# Patient Record
Sex: Female | Born: 1965 | Race: White | Hispanic: No | Marital: Married | Smoking: Never smoker
Health system: Southern US, Community
[De-identification: ages and names within clinical notes are randomized; demographics above are authoritative.]

## PROBLEM LIST (undated history)

## (undated) DIAGNOSIS — K56609 Unspecified intestinal obstruction, unspecified as to partial versus complete obstruction: Secondary | ICD-10-CM

## (undated) DIAGNOSIS — M810 Age-related osteoporosis without current pathological fracture: Secondary | ICD-10-CM

## (undated) HISTORY — DX: Age-related osteoporosis without current pathological fracture: M81.0

---

## 1999-12-04 ENCOUNTER — Other Ambulatory Visit: Admission: RE | Admit: 1999-12-04 | Discharge: 1999-12-04 | Payer: Self-pay | Admitting: Obstetrics and Gynecology

## 2001-02-16 ENCOUNTER — Other Ambulatory Visit: Admission: RE | Admit: 2001-02-16 | Discharge: 2001-02-16 | Payer: Self-pay | Admitting: Obstetrics and Gynecology

## 2002-01-18 HISTORY — PX: BREAST SURGERY: SHX581

## 2002-03-08 ENCOUNTER — Other Ambulatory Visit: Admission: RE | Admit: 2002-03-08 | Discharge: 2002-03-08 | Payer: Self-pay | Admitting: Obstetrics and Gynecology

## 2003-01-19 HISTORY — PX: ABDOMINAL SURGERY: SHX537

## 2003-06-20 ENCOUNTER — Other Ambulatory Visit: Admission: RE | Admit: 2003-06-20 | Discharge: 2003-06-20 | Payer: Self-pay | Admitting: Obstetrics and Gynecology

## 2003-10-02 ENCOUNTER — Ambulatory Visit (HOSPITAL_COMMUNITY): Admission: RE | Admit: 2003-10-02 | Discharge: 2003-10-02 | Payer: Self-pay | Admitting: Gastroenterology

## 2003-11-22 ENCOUNTER — Encounter (INDEPENDENT_AMBULATORY_CARE_PROVIDER_SITE_OTHER): Payer: Self-pay | Admitting: *Deleted

## 2003-11-22 ENCOUNTER — Inpatient Hospital Stay (HOSPITAL_COMMUNITY): Admission: EM | Admit: 2003-11-22 | Discharge: 2003-12-02 | Payer: Self-pay | Admitting: Gastroenterology

## 2004-06-08 ENCOUNTER — Ambulatory Visit (HOSPITAL_COMMUNITY): Admission: RE | Admit: 2004-06-08 | Discharge: 2004-06-08 | Payer: Self-pay | Admitting: Gastroenterology

## 2004-06-08 ENCOUNTER — Encounter (INDEPENDENT_AMBULATORY_CARE_PROVIDER_SITE_OTHER): Payer: Self-pay | Admitting: Specialist

## 2004-08-10 ENCOUNTER — Other Ambulatory Visit: Admission: RE | Admit: 2004-08-10 | Discharge: 2004-08-10 | Payer: Self-pay | Admitting: Obstetrics and Gynecology

## 2007-03-08 ENCOUNTER — Encounter: Admission: RE | Admit: 2007-03-08 | Discharge: 2007-03-08 | Payer: Self-pay | Admitting: Obstetrics and Gynecology

## 2008-03-27 ENCOUNTER — Encounter: Admission: RE | Admit: 2008-03-27 | Discharge: 2008-03-27 | Payer: Self-pay | Admitting: Obstetrics and Gynecology

## 2009-07-25 ENCOUNTER — Encounter: Admission: RE | Admit: 2009-07-25 | Discharge: 2009-07-25 | Payer: Self-pay | Admitting: Obstetrics and Gynecology

## 2009-07-31 ENCOUNTER — Encounter: Admission: RE | Admit: 2009-07-31 | Discharge: 2009-07-31 | Payer: Self-pay | Admitting: Obstetrics and Gynecology

## 2010-02-08 ENCOUNTER — Encounter: Payer: Self-pay | Admitting: Obstetrics and Gynecology

## 2010-06-05 NOTE — Op Note (Signed)
NAMESHERRAL, DIROCCO               ACCOUNT NO.:  [id number]   MEDICAL RECORD NO.:  [id number]          PATIENT TYPE:  AMB   LOCATION:  ENDO                         FACILITY:  [facility]   PHYSICIAN:  Danise Edge, M.D.   DATE OF BIRTH:  1966-01-08   DATE OF PROCEDURE:  06/08/2004  DATE OF DISCHARGE:                                 OPERATIVE REPORT   PROCEDURE INDICATIONS:  Erika Burns is a 45 year old female born  05/05/1965. 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 She underwent a laparoscopic gastrojejunostomy to  relieve mechanical gastric outlet obstruction. She has re-developed some  upper abdominal discomfort and thinks her symptoms suggest recurrent gastric  outlet obstruction.   ENDOSCOPIST:  Danise Edge, M.D.   PREMEDICATION:  Versed 5 mg, Demerol 50 mg.   PROCEDURE:  After obtaining informed consent, Erika Burns was placed in the  left lateral decubitus position. I administered intravenous Demerol and  intravenous Versed to achieve conscious sedation for the procedure. The  patient's blood pressure, oxygen saturation and cardiac rhythm were  monitored throughout the procedure and documented in the medical record.   The Olympus gastroscope was passed through the posterior hypopharynx into  the proximal esophagus without difficulty. The hypopharynx, larynx and vocal  cords appeared normal.   Esophagoscopy: The proximal, mid, and lower segments of the esophageal  mucosa appear completely normal. There are no signs of erosive esophagitis  or Barrett's esophagus.   Gastroscopy: Retroflex view of the gastric cardia and fundus was normal. The  gastric body appears normal. There is a widely patent surgical anastomosis  (gastrojejunostomy). The antrum appears normal. The pylorus is closed.   Jejunoscopy: I was able to intubate the widely patent surgical anastomosis  and examined both limbs of the gastrojejunostomy. There is a  superficial  erosion with exudative base at the surgical anastomosis on the jejunal side  which was biopsied. Otherwise both limbs of the gastrojejunostomy were  normal.   ASSESSMENT:  Normal esophagogastroduodenoscopy post laparoscopic jejunostomy  except for the presence of shallow erosions on the jejunal side at her  anastomosis, which was biopsied.      MJ/MEDQ  D:  06/08/2004  T:  06/08/2004  Job:  [id number]   cc:   Vikki Ports, MD  [address]  [place]  [place] [address]

## 2010-06-05 NOTE — H&P (Signed)
Erika Burns               ACCOUNT NO.:  [id number]   MEDICAL RECORD NO.:  [id number]          PATIENT TYPE:  AMB   LOCATION:  ENDO                         FACILITY:  [facility]   PHYSICIAN:  Danise Edge, M.D.   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   HISTORY:  [name] is a 45 year old female, born August 28, 1965.  On October 02, 2003, [name] underwent an  esophagogastroduodenoscopy performed by Dr. Tasia Catchings, which revealed a  1 cm pyloric channel ulcer with partial gastric outlet obstruction; her  CLOtest was positive for Helicobacter pylori antral gastritis.   [name] has been taking Carafate 1 g 4 times daily.  For the past 3  weeks, she has had symptoms of gastric outlet obstruction manifested by  putrid-smelling belches, vomiting, and upper abdominal bloating.  She  reports no hematemesis, melena, or hematochezia.   Diagnostic esophagogastroduodenoscopy performed this afternoon revealed a  stomach filled with solid food and a pin-hole pyloric channel; her ulcer is  approximately 90-95% healed.   [name] is being admitted to the hospital for probable surgical  management of her mechanical gastric outlet obstruction.   MEDICATION ALLERGIES:  None.   CHRONIC MEDICATIONS:  Zoloft and Carafate.   PAST MEDICAL HISTORY:  Migraine headaches.   FAMILY HISTORY:  Noncontributory.   PHYSICAL EXAMINATION:  GENERAL APPEARANCE:  [name] is alert and  ambulatory.  HEENT:  Normal oral pharynx.  Nonicteric sclerae.  LUNGS:  Clear to auscultation.  CARDIAC:  Regular rhythm without murmurs.  ABDOMEN:  Soft, flat, and nontender.  No succussion splash audible.  EXTREMITIES:  No edema.   ENDOSCOPIST:  Dr. Danise Edge   PREMEDICATION:  1.  Versed 5 mg.  2.  Demerol 70 mg.   PROCEDURE:  Diagnostic esophagogastroduodenoscopy.  After obtaining informed  consent, [name] was placed in the left lateral decubitus position.  I  administered intravenous Demerol and intravenous Versed to achieve conscious  sedation for the procedure.  The patient's blood pressure, oxygen  saturation, and cardiac rhythm were monitored throughout the procedure and  documented in the medical record.   The Olympus gastroscope was passed through the posterior hypopharynx into  the proximal esophagus without difficulty.  The hypopharynx and larynx  appeared normal.  I did not visualize the vocal cords.   ESOPHAGOSCOPY:  The proximal, mid, and lower segments of the esophageal  mucosa appear normal.   GASTROSCOPY:  Upon entering the stomach, the entire stomach is filled with  solid and liquid food matter.  Retroflexed view of the gastric cardia was  normal.  I was unable to examine the gastric fundus, gastric body, or  gastric antrum with any degree of accuracy.  I was able to identify the  pyloric channel which has a pin-hole-type opening.  The pyloric channel  ulcer is 90-95% healed.  I am unable to  pass the endoscope through this  severely narrowed pyloric channel.  Biopsies were taken from the pyloric  channel.   DUODENOSCOPY:  Not performed.   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   PLAN:  1.  IV Protonix.  2.  Surgical consultation.  3.  ? trial of nasogastric suction.      MJ/MEDQ  D:  11/22/2003  T:  11/22/2003  Job:  [id number]   cc:   Abigail Miyamoto, M.D.  [address]  [place]  [place] [address]  Fax: [phone]

## 2010-06-05 NOTE — Consult Note (Signed)
Erika Burns, Erika Burns               ACCOUNT NO.:  [id number]   MEDICAL RECORD NO.:  [id number]          PATIENT TYPE:  INP   LOCATION:  0442                         FACILITY:  [facility]   PHYSICIAN:  Abigail Miyamoto, M.D. DATE OF BIRTH:  1965/07/03   DATE OF CONSULTATION:  11/22/2003  DATE OF DISCHARGE:                                   CONSULTATION   CHIEF COMPLAINT:  Gastric outlet obstruction.   REFERRING PHYSICIAN:  Danise Edge, M.D.   HISTORY OF PRESENT ILLNESS:  This is a 45 year old female who was admitted  today from __________after she was found to have a gastric outlet  obstruction following upper endoscopy.  She had been followed by Dr. Laural Benes  earlier this year and had had a large peptic ulcer and now over the past two  weeks has developed worsening belching, vomiting, and upper abdominal  bloating, prompting the endoscopy today.  Since finding a near occlusion of  the pyloric channel, the patient has been admitted for surgical evaluation.  She currently reports she has no abdominal  pain and no nausea and would  like to try liquids.  She has been moving her bowels well and has had no  chest pain, fevers, or shortness of breath.   PAST MEDICAL HISTORY:  1.  Remarkable for the ulcer disease.  2.  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  3.  Migraine headaches.   PAST SURGICAL HISTORY:  Breast augmentation.   MEDICATIONS:  1.  Zoloft.  2.  Carafate.   ALLERGIES:  She has no known drug allergies.   FAMILY HISTORY:  Noncontributory.   SOCIAL HISTORY:  She drinks alcohol just on occasion and does not smoke.   REVIEW OF SYSTEMS:  Otherwise unremarkable.  She has been moving her bowels  well.   PHYSICAL EXAMINATION:  GENERAL:  Reveals a thin female in no acute distress.  VITAL SIGNS:  She is afebrile.  Her vital signs are stable.  HEENT:  Eyes:  She is anicteric.  Pupils are reactive bilaterally.  Ears,  nose, mouth,  throat:  __________nose normal.  Hearing is normal.  Oropharynx  is clear.  NECK:  Supple.  There is no cervical adenopathy.  There is no thyromegaly.  LUNGS:  Clear to auscultation bilaterally.  Normal respiratory effort.  CARDIOVASCULAR:  Regular rate and rhythm.  There are no murmurs.  There is  no peripheral edema.  EXTREMITIES:  Warm and well perfused.  ABDOMEN:  Soft and nontender.  It is not distended.  There are no masses.  There is no organomegaly.   LABORATORY DATA:  I have the pictures of the endoscopy from Dr. Laural Benes and  records of his care of her.   IMPRESSION/PLAN:  This is a patient with gastric outlet obstruction from  pyloric channel ulcer and scarring.  At this point I have discussed the case  with Dr. Lovie Chol.  She thinks that [name] would be an  appropriate candidate for a laparoscopic gastrojejunostomy to bypass the  area of obstruction.  I am  currently let the patient try liquids tonight and  if she does develop emesis, will place a nasogastric tube and suction out  the stomach the rest of the weekend.  I have discussed this with Dr.  Luan Pulling and she would like to try to get [name] on the operating room  schedule for Monday.  We will follow her closely this weekend.      DB/MEDQ  D:  11/22/2003  T:  11/23/2003  Job:  [id number]   cc:   Danise Edge, M.D.  [address]  [place]  [place] [address]  Fax: [phone]

## 2010-06-05 NOTE — Discharge Summary (Signed)
[name], [name]               ACCOUNT NO.:  [id number]   MEDICAL RECORD NO.:  [id number]          PATIENT TYPE:  INP   LOCATION:  0442                         FACILITY:  [facility]   PHYSICIAN:  Vikki Ports, MDDATE OF BIRTH:  1965-10-26   DATE OF ADMISSION:  11/22/2003  DATE OF DISCHARGE:  12/02/2003                                 DISCHARGE SUMMARY   ADMISSION DIAGNOSIS:  Gastric outlet obstruction.   DISCHARGE DIAGNOSIS:  Gastric outlet obstruction.   PROCEDURE:  Laparoscopic gastrojejunostomy.   CONDITION ON DISCHARGE:  Good and improved.   FOLLOW UP:  With me 1 week after discharge.   HOSPITAL COURSE:  The patient was admitted with a gastric outlet obstruction  and underwent upper endoscopy by Dr. Reece Agar.  This showed a near  complete gastric outlet obstruction secondary to ulcer disease.  The patient  tolerated some clears for the first few days in the hospital and then was  prepared for surgery which he underwent on November 25, 2003.  She underwent  laparoscopy and laparoscopic gastrojejunostomy.  Postoperatively, she did  well, had an NG tube in place.  This was removed on postoperative day #3.  She had Dulcolax suppository.  She was started on clear liquids, had one  episode of emesis, and abdominal x-rays were concerning for an ileus versus  a bowel obstruction; however, over the next day, she felt much better,  started having bowel movements, tolerated a regular diet, and was discharged  home.     Erika Burns   KRH/MEDQ  D:  12/23/2003  T:  12/24/2003  Job:  [id number]

## 2010-06-05 NOTE — Op Note (Signed)
Erika Burns, HEBEL               ACCOUNT NO.:  [id number]   MEDICAL RECORD NO.:  [id number]          PATIENT TYPE:  INP   LOCATION:  0442                         FACILITY:  [facility]   PHYSICIAN:  Vikki Ports, MDDATE OF BIRTH:  1966-01-12   DATE OF PROCEDURE:  11/25/2003  DATE OF DISCHARGE:                                 OPERATIVE REPORT   PREOPERATIVE DIAGNOSIS:  Gastric outlet obstruction.   POSTOPERATIVE DIAGNOSIS:  Gastric outlet obstruction.   PROCEDURE:  Laparoscopic gastrojejunostomy.   SURGEON:  Vikki Ports, MD   ASSISTANT:  Thornton Park. Daphine Deutscher, MD   ANESTHESIA:  General.   DESCRIPTION OF PROCEDURE:  Patient was taken to the operating room and  placed in the supine position.  After adequate anesthesia was induced using  the endotracheal tube, the abdomen was prepped and draped in the normal  sterile fashion using an 11 mm Optiview trocar in the right mid abdomen.  Peritoneal access was obtained, and pneumoperitoneum was obtained.  A 12 mm  trocar was placed in the right middle quadrant, and an additional 11 mm  trocar was placed in the left infraumbilical in the paramedian position, and  a 5 mm trocar was placed in the left anterior axillary line.  The ligament  of Treitz was identified, and counting 40-50 cm distal to this, a loop of  small bowel was brought up next to the stomach.  A posterior layer of 2-0  silk was fashioned in the antrum to the jejunum.  A side-to-side  gastrojejunostomy was performed in the standard fashion using a 6 cm auto-  suture blue-load GIA.  The defect was closed with a running 2-0 Vicryl  sutures.  The antral serosal layer was closed with a running 2-0 Vicryl  suture.  It was reinforced with Tisseel.  NG tube was placed into the  stomach.  Pneumoperitoneum was released.  Trocars were removed.  The 12 mm  trocar set was closed with a figure-of-eight fascial suture.  Skin was  closed with subcuticular 4-0 Monocryl.   Steri-Strips and sterile dressings  were applied.  The patient tolerated the procedure well and went to the PACU  in good condition.     [name]   KRH/MEDQ  D:  11/25/2003  T:  11/25/2003  Job:  [id number]   cc:   Danise Edge, M.D.  [address]  [place]  [place] [address]  Fax: [phone]

## 2011-05-06 ENCOUNTER — Other Ambulatory Visit: Payer: Self-pay | Admitting: Obstetrics and Gynecology

## 2011-05-06 DIAGNOSIS — N63 Unspecified lump in unspecified breast: Secondary | ICD-10-CM

## 2011-05-10 ENCOUNTER — Ambulatory Visit
Admission: RE | Admit: 2011-05-10 | Discharge: 2011-05-10 | Disposition: A | Discharge status: Home / Self Care | Payer: 59 | Source: Ambulatory Visit | Attending: Obstetrics and Gynecology | Admitting: Obstetrics and Gynecology

## 2011-05-10 ENCOUNTER — Other Ambulatory Visit: Payer: Self-pay | Admitting: Obstetrics and Gynecology

## 2011-05-10 DIAGNOSIS — N63 Unspecified lump in unspecified breast: Secondary | ICD-10-CM

## 2011-07-19 ENCOUNTER — Other Ambulatory Visit: Payer: Self-pay | Admitting: Obstetrics and Gynecology

## 2011-07-19 DIAGNOSIS — N6009 Solitary cyst of unspecified breast: Secondary | ICD-10-CM

## 2011-08-09 ENCOUNTER — Ambulatory Visit
Admission: RE | Admit: 2011-08-09 | Discharge: 2011-08-09 | Disposition: A | Discharge status: Home / Self Care | Payer: 59 | Source: Ambulatory Visit | Attending: Obstetrics and Gynecology | Admitting: Obstetrics and Gynecology

## 2011-08-09 DIAGNOSIS — N6009 Solitary cyst of unspecified breast: Secondary | ICD-10-CM

## 2011-12-27 ENCOUNTER — Other Ambulatory Visit: Payer: Self-pay | Admitting: Family Medicine

## 2011-12-27 DIAGNOSIS — R109 Unspecified abdominal pain: Secondary | ICD-10-CM

## 2011-12-31 ENCOUNTER — Ambulatory Visit
Admission: RE | Admit: 2011-12-31 | Discharge: 2011-12-31 | Disposition: A | Discharge status: Home / Self Care | Payer: 59 | Source: Ambulatory Visit | Attending: Family Medicine | Admitting: Family Medicine

## 2011-12-31 DIAGNOSIS — R109 Unspecified abdominal pain: Secondary | ICD-10-CM

## 2012-04-05 ENCOUNTER — Other Ambulatory Visit: Payer: Self-pay | Admitting: Gastroenterology

## 2012-04-05 DIAGNOSIS — R1032 Left lower quadrant pain: Secondary | ICD-10-CM

## 2012-04-07 ENCOUNTER — Ambulatory Visit
Admission: RE | Admit: 2012-04-07 | Discharge: 2012-04-07 | Disposition: A | Discharge status: Home / Self Care | Payer: 59 | Source: Ambulatory Visit | Attending: Gastroenterology | Admitting: Gastroenterology

## 2012-04-07 DIAGNOSIS — R1032 Left lower quadrant pain: Secondary | ICD-10-CM

## 2012-04-07 MED ORDER — IOHEXOL 300 MG/ML  SOLN
100.0000 mL | Freq: Once | INTRAMUSCULAR | Status: AC | PRN
  Administered 2012-04-07: 100 mL via INTRAVENOUS

## 2012-04-14 ENCOUNTER — Encounter (HOSPITAL_COMMUNITY): Payer: Self-pay | Admitting: *Deleted

## 2012-04-14 ENCOUNTER — Inpatient Hospital Stay (HOSPITAL_COMMUNITY)
Admission: EM | Admit: 2012-04-14 | Discharge: 2012-04-15 | DRG: 392 | Disposition: A | Discharge status: Home / Self Care | Payer: 59 | Attending: Internal Medicine | Admitting: Internal Medicine

## 2012-04-14 DIAGNOSIS — R109 Unspecified abdominal pain: Secondary | ICD-10-CM

## 2012-04-14 DIAGNOSIS — Z8711 Personal history of peptic ulcer disease: Secondary | ICD-10-CM

## 2012-04-14 DIAGNOSIS — K3184 Gastroparesis: Principal | ICD-10-CM | POA: Diagnosis present

## 2012-04-14 DIAGNOSIS — K859 Acute pancreatitis without necrosis or infection, unspecified: Secondary | ICD-10-CM

## 2012-04-14 DIAGNOSIS — R748 Abnormal levels of other serum enzymes: Secondary | ICD-10-CM | POA: Diagnosis present

## 2012-04-14 HISTORY — DX: Unspecified intestinal obstruction, unspecified as to partial versus complete obstruction: K56.609

## 2012-04-14 HISTORY — DX: Unspecified abdominal pain: R10.9

## 2012-04-14 LAB — CBC WITH DIFFERENTIAL/PLATELET
Basophils Relative: 0 % (ref 0–1)
HCT: 37.5 % (ref 36.0–46.0)
Hemoglobin: 12.5 g/dL (ref 12.0–15.0)
Lymphs Abs: 1.9 10*3/uL (ref 0.7–4.0)
MCH: 27.5 pg (ref 26.0–34.0)
MCHC: 33.3 g/dL (ref 30.0–36.0)
Monocytes Absolute: 0.4 10*3/uL (ref 0.1–1.0)
Monocytes Relative: 5 % (ref 3–12)
Neutro Abs: 6.8 10*3/uL (ref 1.7–7.7)

## 2012-04-14 LAB — COMPREHENSIVE METABOLIC PANEL
ALT: 20 U/L (ref 0–35)
AST: 20 U/L (ref 0–37)
Albumin: 3.8 g/dL (ref 3.5–5.2)
Alkaline Phosphatase: 70 U/L (ref 39–117)
BUN: 11 mg/dL (ref 6–23)
CO2: 28 mEq/L (ref 19–32)
Calcium: 9.1 mg/dL (ref 8.4–10.5)
Chloride: 101 mEq/L (ref 96–112)
Creatinine, Ser: 0.68 mg/dL (ref 0.50–1.10)
GFR calc Af Amer: >90 mL/min (ref >90)
GFR calc non Af Amer: >90 mL/min (ref >90)
Glucose, Bld: 103 mg/dL — ABNORMAL HIGH (ref 70–99)
Potassium: 3.8 mEq/L (ref 3.5–5.1)
Sodium: 138 mEq/L (ref 135–145)
Total Bilirubin: 0.1 mg/dL — ABNORMAL LOW (ref 0.3–1.2)
Total Protein: 6.9 g/dL (ref 6.0–8.3)

## 2012-04-14 LAB — URINE MICROSCOPIC-ADD ON

## 2012-04-14 LAB — URINALYSIS, ROUTINE W REFLEX MICROSCOPIC
Glucose, UA: NEGATIVE mg/dL
Hgb urine dipstick: NEGATIVE
Protein, ur: NEGATIVE mg/dL
Specific Gravity, Urine: 1.012 (ref 1.005–1.030)
pH: 7.5 (ref 5.0–8.0)

## 2012-04-14 MED ORDER — ONDANSETRON 4 MG PO TBDP
8.0000 mg | ORAL_TABLET | Freq: Once | ORAL | Status: AC
  Administered 2012-04-14: 8 mg via ORAL

## 2012-04-14 MED ORDER — HYDROMORPHONE HCL PF 1 MG/ML IJ SOLN: 1.0000 mg | INTRAMUSCULAR | Status: AC | PRN

## 2012-04-14 MED ORDER — METOCLOPRAMIDE HCL 5 MG/ML IJ SOLN
10.0000 mg | Freq: Once | INTRAMUSCULAR | Status: AC
  Administered 2012-04-14: 10 mg via INTRAVENOUS
  Filled 2012-04-14: qty 2

## 2012-04-14 MED ORDER — SODIUM CHLORIDE 0.9 % IV SOLN
INTRAVENOUS | Status: AC
  Administered 2012-04-14 – 2012-04-15 (×2): via INTRAVENOUS

## 2012-04-14 MED ORDER — ONDANSETRON HCL 4 MG/2ML IJ SOLN: 4.0000 mg | Freq: Three times a day (TID) | INTRAMUSCULAR | Status: AC | PRN

## 2012-04-14 MED ORDER — DIPHENHYDRAMINE HCL 50 MG/ML IJ SOLN
25.0000 mg | Freq: Once | INTRAMUSCULAR | Status: AC
  Administered 2012-04-14: 25 mg via INTRAVENOUS
  Filled 2012-04-14: qty 1

## 2012-04-14 MED ORDER — ONDANSETRON 4 MG PO TBDP
ORAL_TABLET | ORAL | Status: AC
  Filled 2012-04-14: qty 2

## 2012-04-14 MED ORDER — ONDANSETRON HCL 4 MG/2ML IJ SOLN
4.0000 mg | Freq: Once | INTRAMUSCULAR | Status: AC
  Administered 2012-04-14: 4 mg via INTRAVENOUS
  Filled 2012-04-14: qty 2

## 2012-04-14 MED ORDER — ENOXAPARIN SODIUM 40 MG/0.4ML ~~LOC~~ SOLN
40.0000 mg | SUBCUTANEOUS | Status: DC
  Administered 2012-04-14: 40 mg via SUBCUTANEOUS
  Filled 2012-04-14 (×2): qty 0.4

## 2012-04-14 MED ORDER — HYDROMORPHONE HCL PF 1 MG/ML IJ SOLN
1.0000 mg | Freq: Once | INTRAMUSCULAR | Status: AC
  Administered 2012-04-14: 1 mg via INTRAVENOUS
  Filled 2012-04-14: qty 1

## 2012-04-14 MED ORDER — SODIUM CHLORIDE 0.9 % IV BOLUS (SEPSIS)
1000.0000 mL | Freq: Once | INTRAVENOUS | Status: AC
  Administered 2012-04-14: 1000 mL via INTRAVENOUS

## 2012-04-14 NOTE — ED Notes (Signed)
Pt with LLQ pain and epigastric bloating x 3 months.  Hx of bowel obstruction in 2005.  Dr Doristine Locks performed vag exam and [place] with neg results.  Last week Dr Randa Evens with [facility] ordered CT which was inconclusive.  Presently pt continues to c/o epigastric bloating approx 30 min after she eats.  Today emesis x 1 with nausea all day and lower back pain.  States bm every 3 days vs every day.

## 2012-04-14 NOTE — H&P (Signed)
History and Physical  Erika Burns ZOX:[id number] DOB: [birth year] DOA: 04/14/2012  Referring physician: ER PCP: Astrid Divine, MD   Chief Complaint: Abdominal pain  HPI: Patient is a 47 year old female with past medical history most significant for abdominal pain going on for last 4 months. Patient does have a history of bowel obstruction in 2005. She has been having crampy mid and upper abdominal pain for the last 4 months. Pain is worse after eating. Pain radiates to the back. There is associated abdominal bloating. She thought might be lactose intolerance and has cut out milk products but symptoms have persisted. Pain is moderate to severe and she rates it at 5/10. There is associated nausea but no vomiting. She denies constipation or diarrhea. Denies fever chills or sweats. She thinks she may have lost 3 pounds during the course of her illness. She has been taking acid reducers with no benefit. Her PCP I gave her prescription for tramadol which did not give any relief. She has seen her physician who has ordered ultrasounds and CT scans with no answer found. She is also seeing a gastroenterologist. She came in today because the pain is worse.  Evaluation in the ER was negative except elevated lipase. Unit physician wanted me to admit the patient for presumed pancreatitis and pain control.  Review of Systems:  15 point review of systems is negative except for as noted.  Past Medical History  Diagnosis Date  . Bowel obstruction     Past Surgical History  Procedure Laterality Date  . Abdominal surgery  2005    repair of bowel obstruction  . Breast surgery  2004    breast implants    Social History:  reports that she has never smoked. She does not have any smokeless tobacco history on file. She reports that  drinks alcohol. She reports that she does not use illicit drugs.  No Known Allergies  No family history on file.   Prior to Admission medications   Not on File    Physical Exam: Filed Vitals:   04/14/12 1720 04/14/12 2037  BP: 117/79 105/65  Pulse: 70 61  Temp: 98.4 F (36.9 C) 97.7 F (36.5 C)  TempSrc: Oral   Resp: 16 16  SpO2: 98% 100%   Physical Exam: General: Vital signs reviewed and noted. Well-developed, well-nourished, in no acute distress; alert, appropriate and cooperative throughout examination.  Head: Normocephalic, atraumatic.  Eyes: PERRL, EOMI, No signs of anemia or jaundince.  Nose: Mucous membranes moist, not inflammed, nonerythematous.  Throat: Oropharynx nonerythematous, no exudate appreciated.   Neck: No deformities, masses, or tenderness noted.Supple, No carotid Bruits, no JVD.  Lungs:  Normal respiratory effort. Clear to auscultation BL without crackles or wheezes.  Heart: RRR. S1 and S2 normal without gallop, murmur, or rubs.  Abdomen:  BS normoactive. Soft, Nondistended, mild epigastric and periumbilical tenderness.  No masses or organomegaly.  Extremities: No pretibial edema.  Neurologic: A&O X3, CN II - XII are grossly intact. Motor strength is 5/5 in the all 4 extremities, Sensations intact to light touch, Cerebellar signs negative.  Skin: No visible rashes, scars.     Wt Readings from Last 3 Encounters:  No data found for Wt    Labs on Admission:  Basic Metabolic Panel:  Recent Labs Lab 04/14/12 1725  NA 138  K 3.8  CL 101  CO2 28  GLUCOSE 103*  BUN 11  CREATININE 0.68  CALCIUM 9.1    Liver Function Tests:  Recent Labs Lab  04/14/12 1725  AST 20  ALT 20  ALKPHOS 70  BILITOT 0.1*  PROT 6.9  ALBUMIN 3.8    Recent Labs Lab 04/14/12 1725  LIPASE 133*    CBC:  Recent Labs Lab 04/14/12 1725  WBC 9.2  NEUTROABS 6.8  HGB 12.5  HCT 37.5  MCV 82.4  PLT 417*    Principal Problem:   Abdominal pain in female patient   Assessment/Plan 1. Patient is a 47 year old female with past medical history most significant for previous abdominal surgery for abdominal obstruction 5 years  ago comes in with 4 months of abdominal pain which has been worked up in the outpatient with negative abdominal ultrasound and CT scan(as per patient , results to be obtained) and currently under the care of gastroenterologist Dr. Randa Evens comes in with acute worsening of her chronic abdominal pain. He showed up in the ER was negative except elevated lipase. He does not meet the criteria for pancreatitis which is 3 times the upper limit of normal. Unit physician wants to admit the patient for pain control and further workup.  -Admit to MedSurg bed -Normal diet -Repeat lipase and check amylase in the morning -Make the patient n.p.o. if lipase is still elevated and more than 3 times upper limit of normal -Supportive treatment with pain control and IV fluids -Obtain medical records from Dr. Randa Evens and primary care physician -No indication for any imaging studies at this time  Code Status: Full code Family Communication: Updated at bedside Disposition Plan/Anticipated LOS: Guarded  Time spent: 70 minutes  Lars Mage, MD  [facility] Team 5  If 7PM-7AM, please contact night-coverage at [email or web address], password [facility] 04/14/2012, 8:53 PM

## 2012-04-14 NOTE — ED Provider Notes (Addendum)
History     CSN: [id number]  Arrival date & time 04/14/12  1653   First MD Initiated Contact with Patient 04/14/12 1941     Chief complaint: Abdominal pain  (Consider location/radiation/quality/duration/timing/severity/associated sxs/prior treatment) Patient is a 47 y.o. female presenting with abdominal pain. The history is provided by the patient.  Abdominal Pain She has been having crampy mid and upper abdominal pain for the last 4 months. Pain is worse after eating. Pain radiates to the back. There is associated abdominal bloating. She thought might be lactose intolerance and has cut out milk products but symptoms have persisted. Pain is moderate to severe and she rates it at 6/10. There is associated nausea and vomiting. She denies constipation or diarrhea. Denies fever chills or sweats. She thinks she may have lost 3 pounds during the course of her illness. She has been taking acid reducers with no benefit. Her PCP I gave her prescription for tramadol which did not give any relief. She has seen her physician who has ordered ultrasounds and CT scans with no answer found. She is also seeing a gastroenterologist. She came in today because the pain is worse.  Past Medical History  Diagnosis Date  . Bowel obstruction     Past Surgical History  Procedure Laterality Date  . Abdominal surgery  2005    repair of bowel obstruction  . Breast surgery  2004    breast implants    No family history on file.  History  Substance Use Topics  . Smoking status: Never Smoker   . Smokeless tobacco: Not on file  . Alcohol Use: Yes     Comment: occasionally    OB History   Grav Para Term Preterm Abortions TAB SAB Ect Mult Living                  Review of Systems  Gastrointestinal: Positive for abdominal pain.  All other systems reviewed and are negative.    Allergies  Review of patient's allergies indicates no known allergies.  Home Medications  No current outpatient prescriptions  on file.  BP 117/79  Pulse 70  Temp(Src) 98.4 F (36.9 C) (Oral)  Resp 16  SpO2 98%  Physical Exam  Nursing note and vitals reviewed.  47 year old female, resting comfortably and in no acute distress. Vital signs are normal. Oxygen saturation is 98%, which is normal. Head is normocephalic and atraumatic. PERRLA, EOMI. Oropharynx is clear. Neck is nontender and supple without adenopathy or JVD. Back is nontender and there is no CVA tenderness. Lungs are clear without rales, wheezes, or rhonchi. Chest is nontender. Heart has regular rate and rhythm without murmur. Abdomen is soft, flat, with mild epigastric and periumbilical tenderness. There is no rebound or guarding. There are no masses or hepatosplenomegaly and peristalsis is hypoactive. Extremities have no cyanosis or edema, full range of motion is present. Skin is warm and dry without rash. Neurologic: Mental status is normal, cranial nerves are intact, there are no motor or sensory deficits.  ED Course  Procedures (including critical care time)  Labs Reviewed  COMPREHENSIVE METABOLIC PANEL - Abnormal; Notable for the following:    Glucose, Bld 103 (*)    Total Bilirubin 0.1 (*)    All other components within normal limits  CBC WITH DIFFERENTIAL - Abnormal; Notable for the following:    Platelets 417 (*)    All other components within normal limits  LIPASE, BLOOD - Abnormal; Notable for the following:  Lipase 133 (*)    All other components within normal limits  URINALYSIS, ROUTINE W REFLEX MICROSCOPIC   No results found.   No diagnosis found.    MDM  Pancreatitis of uncertain cause. Curiously, was not seen on CT scan. Given the chronicity of the symptoms additional workup may be indicated such as ERCP or MRI.  Case is discussed with Dr. Eben Burow of [facility] who agrees to admit the patient   Dione Booze, MD 04/14/12 2004  Dione Booze, MD 04/14/12 2004

## 2012-04-15 ENCOUNTER — Encounter (HOSPITAL_COMMUNITY): Payer: Self-pay | Admitting: *Deleted

## 2012-04-15 ENCOUNTER — Encounter (HOSPITAL_COMMUNITY)
Admission: EM | Disposition: A | Discharge status: Home / Self Care | Payer: Self-pay | Source: Home / Self Care | Attending: Internal Medicine

## 2012-04-15 DIAGNOSIS — R109 Unspecified abdominal pain: Secondary | ICD-10-CM

## 2012-04-15 HISTORY — PX: ESOPHAGOGASTRODUODENOSCOPY: SHX5428

## 2012-04-15 LAB — LIPASE, BLOOD: Lipase: 47 U/L (ref 11–59)

## 2012-04-15 LAB — HM COLONOSCOPY

## 2012-04-15 SURGERY — EGD (ESOPHAGOGASTRODUODENOSCOPY)
Anesthesia: Moderate Sedation

## 2012-04-15 MED ORDER — PANTOPRAZOLE SODIUM 40 MG IV SOLR
40.0000 mg | INTRAVENOUS | Status: DC
  Administered 2012-04-15: 40 mg via INTRAVENOUS
  Filled 2012-04-15: qty 40

## 2012-04-15 MED ORDER — PANTOPRAZOLE SODIUM 40 MG PO TBEC: 40.0000 mg | DELAYED_RELEASE_TABLET | Freq: Every day | ORAL | Status: DC

## 2012-04-15 MED ORDER — PANTOPRAZOLE SODIUM 40 MG PO TBEC
40.0000 mg | DELAYED_RELEASE_TABLET | Freq: Every day | ORAL | Status: DC
  Filled 2012-04-15: qty 1

## 2012-04-15 MED ORDER — METOCLOPRAMIDE HCL 5 MG PO TABS
5.0000 mg | ORAL_TABLET | Freq: Three times a day (TID) | ORAL | Status: DC
Start: 2012-04-15 — End: 2012-04-15
  Administered 2012-04-15: 5 mg via ORAL
  Filled 2012-04-15 (×2): qty 1

## 2012-04-15 MED ORDER — DIPHENHYDRAMINE HCL 50 MG/ML IJ SOLN
INTRAMUSCULAR | Status: AC
  Filled 2012-04-15: qty 1

## 2012-04-15 MED ORDER — ACETAMINOPHEN 325 MG PO TABS
650.0000 mg | ORAL_TABLET | Freq: Four times a day (QID) | ORAL | Status: DC | PRN
  Administered 2012-04-15: 650 mg via ORAL
  Filled 2012-04-15: qty 2

## 2012-04-15 MED ORDER — METOCLOPRAMIDE HCL 5 MG PO TABS: 5.0000 mg | ORAL_TABLET | Freq: Three times a day (TID) | ORAL | Status: DC

## 2012-04-15 MED ORDER — BUTAMBEN-TETRACAINE-BENZOCAINE 2-2-14 % EX AERO
INHALATION_SPRAY | CUTANEOUS | Status: DC | PRN
  Administered 2012-04-15: 2 via TOPICAL

## 2012-04-15 MED ORDER — FENTANYL CITRATE 0.05 MG/ML IJ SOLN
INTRAMUSCULAR | Status: AC
  Filled 2012-04-15: qty 4

## 2012-04-15 MED ORDER — MIDAZOLAM HCL 5 MG/ML IJ SOLN
INTRAMUSCULAR | Status: AC
  Filled 2012-04-15: qty 2

## 2012-04-15 MED ORDER — MIDAZOLAM HCL 10 MG/2ML IJ SOLN
INTRAMUSCULAR | Status: DC | PRN
  Administered 2012-04-15 (×2): 2.5 mg via INTRAVENOUS

## 2012-04-15 MED ORDER — FENTANYL CITRATE 0.05 MG/ML IJ SOLN
INTRAMUSCULAR | Status: DC | PRN
  Administered 2012-04-15 (×2): 25 ug via INTRAVENOUS

## 2012-04-15 MED ORDER — SODIUM CHLORIDE 0.9 % IV SOLN: INTRAVENOUS | Status: DC

## 2012-04-15 NOTE — Discharge Summary (Signed)
PATIENT DETAILS Name: Erika Burns Age: 47 y.o. Sex: female Date of Birth: Jun 04, 1965 MRN: [id number]. Admit Date: 04/14/2012 Admitting Physician: Lars Mage, MD WUJ:WJXBJYN,WGNFAO [name], MD  Recommendations for Outpatient Follow-up:  1. Reassess symptoms-Reglan for presumed gastroparesis has been started. 2. If still having abdominal pain and bloating-further workup to be pursued in the outpatient setting  PRIMARY DISCHARGE DIAGNOSIS:  Principal Problem:   Abdominal pain- probably secondary to gastroparesis     PAST MEDICAL HISTORY: Past Medical History  Diagnosis Date  . Bowel obstruction     DISCHARGE MEDICATIONS:   Medication List    TAKE these medications       metoCLOPramide 5 MG tablet  Commonly known as:  REGLAN  Take 1 tablet (5 mg total) by mouth 3 (three) times daily before meals.     pantoprazole 40 MG tablet  Commonly known as:  PROTONIX  Take 1 tablet (40 mg total) by mouth daily at 12 noon.         BRIEF HPI:  See H&P, Labs, Consult and Test reports for all details in brief, patient was admitted severe abdominal pain, lab abnormalities revealed mild lipase elevation.  CONSULTATIONS:   None  PERTINENT RADIOLOGIC STUDIES: Ct Abdomen Pelvis W Contrast  04/07/2012  *RADIOLOGY REPORT*  Clinical Data: Left lower quadrant abdominal pain and bloating. Some nausea and vomiting.  Previous partial gastrectomy.  CT ABDOMEN AND PELVIS WITH CONTRAST  Technique:  Multidetector CT imaging of the abdomen and pelvis was performed following the standard protocol during bolus administration of intravenous contrast.  Contrast: OMNIPAQUE IOHEXOL 300 MG/ML  SOLN  Comparison: None.  Findings: Evidence of Billroth II anastomosis.  The liver, spleen, pancreas, adrenal glands, and kidneys are normal except for a 7 mm cyst in the lower pole of the right kidney.  Biliary tree is normal.  There are no dilated loops of large or small bowel.  The cecum lies low in the right  side of the pelvis.  Normal appendix. Uterus and ovaries are normal except for a 19 mm cyst on the left ovary.  No free air or free fluid.  No osseous abnormality.  IMPRESSION: Benign-appearing abdomen and pelvis.  Small cysts in the right kidney and on the left ovary.   Original Report Authenticated By: Francene Boyers, M.D.      PERTINENT LAB RESULTS: CBC:  Recent Labs  04/14/12 1725  WBC 9.2  HGB 12.5  HCT 37.5  PLT 417*   CMET CMP     Component Value Date/Time   NA 138 04/14/2012 1725   K 3.8 04/14/2012 1725   CL 101 04/14/2012 1725   CO2 28 04/14/2012 1725   GLUCOSE 103* 04/14/2012 1725   BUN 11 04/14/2012 1725   CREATININE 0.68 04/14/2012 1725   CALCIUM 9.1 04/14/2012 1725   PROT 6.9 04/14/2012 1725   ALBUMIN 3.8 04/14/2012 1725   AST 20 04/14/2012 1725   ALT 20 04/14/2012 1725   ALKPHOS 70 04/14/2012 1725   BILITOT 0.1* 04/14/2012 1725   GFRNONAA >90 04/14/2012 1725   GFRAA >90 04/14/2012 1725    GFR Estimated Creatinine Clearance: 73 ml/min (by C-G formula based on Cr of 0.68).  Recent Labs  04/14/12 1725 04/15/12 0500  LIPASE 133* 47  AMYLASE  --  71   No results found for this basename: CKTOTAL, CKMB, CKMBINDEX, TROPONINI,  in the last 72 hours No components found with this basename: POCBNP,  No results found for this basename: DDIMER,  in  the last 72 hours No results found for this basename: HGBA1C,  in the last 72 hours No results found for this basename: CHOL, HDL, LDLCALC, TRIG, CHOLHDL, LDLDIRECT,  in the last 72 hours No results found for this basename: TSH, T4TOTAL, FREET3, T3FREE, THYROIDAB,  in the last 72 hours No results found for this basename: VITAMINB12, FOLATE, FERRITIN, TIBC, IRON, RETICCTPCT,  in the last 72 hours Coags: No results found for this basename: PT, INR,  in the last 72 hours Microbiology: No results found for this or any previous visit (from the past 240 hour(s)).   BRIEF HOSPITAL COURSE:   Principal Problem:   Abdominal pain- likely  secondary to probable gastroparesis - Patient for the past 4 months has been having intermittent but recurrent abdominal pain. Pain is mostly after eating food. A. outpatient CT scan of the abdomen and pelvis was negative, a outpatient pelvic/transvaginal ultrasound was also negative. Because of severe abdominal pain she presented to the ED, was found to have mild lipase elevation and admitted to the hospitalist service. She was given supportive care with IV fluids and as needed narcotics. Because of prior history of peptic ulcer disease requiring Billroth II surgery, GI was consulted for a EGD. EGD was done on 3/29- with no significant abnormalities like a stomal ulcer, however old undigested food was seen suggesting that the patient likely had gastroparesis. Patient will be discharged on a trial of metoclopramide. She has been extensively educated regarding the importance of eating small but frequent meals. She will be discharged home today, she has a pre-existing appointment with Dr. Randa Evens on 4/9 she has been asked to keep. Further workup in this situation can be pursued in the outpatient setting. All of this was explained to the patient and the patient's spouse at bedside, and they were very understanding.  TODAY-DAY OF DISCHARGE:  Subjective:   [name] today has no headache,no chest abdominal pain,no new weakness tingling or numbness, feels much better wants to go home today.   Objective:   Blood pressure 113/73, pulse 50, temperature 97.5 F (36.4 C), temperature source Axillary, resp. rate 16, height 5\' 4"  (1.626 m), weight 53.797 kg (118 lb 9.6 oz), SpO2 98.00%.  Intake/Output Summary (Last 24 hours) at 04/15/12 1451 Last data filed at 04/15/12 1418  Gross per 24 hour  Intake   1600 ml  Output      0 ml  Net   1600 ml    Exam Awake Alert, Oriented *3, No new F.N deficits, Normal affect [place].AT,PERRAL Supple Neck,No JVD, No cervical lymphadenopathy appriciated.  Symmetrical  Chest wall movement, Good air movement bilaterally, CTAB RRR,No Gallops,Rubs or new Murmurs, No Parasternal Heave +ve B.Sounds, Abd Soft, Non tender, No organomegaly appriciated, No rebound -guarding or rigidity. No Cyanosis, Clubbing or edema, No new Rash or bruise  DISCHARGE CONDITION: Stable  DISPOSITION: HOME  DISCHARGE INSTRUCTIONS:    Activity:  As tolerated   Diet recommendation: Gastroparesis diet      Follow-up Information   Follow up with Astrid Divine, MD. Schedule an appointment as soon as possible for a visit in 1 week.   Contact information:   [address], [address] [place] [address] [phone]       Follow up with Vertell Novak, MD On 04/26/2012.   Contact information:   [address]  [place] [address] [phone]      Total Time spent on discharge equals 45 minutes.  SignedJeoffrey Massed 04/15/2012 2:51 PM

## 2012-04-15 NOTE — Consult Note (Signed)
Reason for Consult: Abdominal pain Referring Physician: Hospital team  Erika Burns is an 47 y.o. female.  HPI: Patient known to my partner Dr. Randa Evens with about 4 month history of slightly changing abdominal pain with some nausea that comes and goes workup so far has been nondiagnostic and her history has been reviewed and her lower bowels have changed somewhat but is not related to the pain and her family history is pertinent for diverticular disease but no gallbladder problems and she is actually feeling better today and the hospital team has suggested an endoscopy and her case was discussed with her husband as well and her office computer chart was reviewed  Past Medical History  Diagnosis Date  . Bowel obstruction     Past Surgical History  Procedure Laterality Date  . Abdominal surgery  2005    repair of bowel obstruction  . Breast surgery  2004    breast implants    History reviewed. No pertinent family history.  Social History:  reports that she has never smoked. She does not have any smokeless tobacco history on file. She reports that  drinks alcohol. She reports that she does not use illicit drugs.  Allergies: No Known Allergies  Medications: I have reviewed the patient's current medications.  Results for orders placed during the hospital encounter of 04/14/12 (from the past 48 hour(s))  COMPREHENSIVE METABOLIC PANEL     Status: Abnormal   Collection Time    04/14/12  5:25 PM      Result Value Range   Sodium 138  135 - 145 mEq/L   Potassium 3.8  3.5 - 5.1 mEq/L   Chloride 101  96 - 112 mEq/L   CO2 28  19 - 32 mEq/L   Glucose, Bld 103 (*) 70 - 99 mg/dL   BUN 11  6 - 23 mg/dL   Creatinine, Ser 4.69  0.50 - 1.10 mg/dL   Calcium 9.1  8.4 - 62.9 mg/dL   Total Protein 6.9  6.0 - 8.3 g/dL   Albumin 3.8  3.5 - 5.2 g/dL   AST 20  0 - 37 U/L   ALT 20  0 - 35 U/L   Alkaline Phosphatase 70  39 - 117 U/L   Total Bilirubin 0.1 (*) 0.3 - 1.2 mg/dL   GFR calc non Af Amer  >90  >90 mL/min   GFR calc Af Amer >90  >90 mL/min   Comment:            The eGFR has been calculated     using the CKD EPI equation.     This calculation has not been     validated in all clinical     situations.     eGFR's persistently     <90 mL/min signify     possible Chronic Kidney Disease.  CBC WITH DIFFERENTIAL     Status: Abnormal   Collection Time    04/14/12  5:25 PM      Result Value Range   WBC 9.2  4.0 - 10.5 K/uL   RBC 4.55  3.87 - 5.11 MIL/uL   Hemoglobin 12.5  12.0 - 15.0 g/dL   HCT 52.8  41.3 - 24.4 %   MCV 82.4  78.0 - 100.0 fL   MCH 27.5  26.0 - 34.0 pg   MCHC 33.3  30.0 - 36.0 g/dL   RDW 01.0  27.2 - 53.6 %   Platelets 417 (*) 150 - 400 K/uL  Neutrophils Relative 74  43 - 77 %   Neutro Abs 6.8  1.7 - 7.7 K/uL   Lymphocytes Relative 20  12 - 46 %   Lymphs Abs 1.9  0.7 - 4.0 K/uL   Monocytes Relative 5  3 - 12 %   Monocytes Absolute 0.4  0.1 - 1.0 K/uL   Eosinophils Relative 1  0 - 5 %   Eosinophils Absolute 0.1  0.0 - 0.7 K/uL   Basophils Relative 0  0 - 1 %   Basophils Absolute 0.0  0.0 - 0.1 K/uL  LIPASE, BLOOD     Status: Abnormal   Collection Time    04/14/12  5:25 PM      Result Value Range   Lipase 133 (*) 11 - 59 U/L  URINALYSIS, ROUTINE W REFLEX MICROSCOPIC     Status: Abnormal   Collection Time    04/14/12  7:24 PM      Result Value Range   Color, Urine YELLOW  YELLOW   APPearance CLEAR  CLEAR   Specific Gravity, Urine 1.012  1.005 - 1.030   pH 7.5  5.0 - 8.0   Glucose, UA NEGATIVE  NEGATIVE mg/dL   Hgb urine dipstick NEGATIVE  NEGATIVE   Bilirubin Urine NEGATIVE  NEGATIVE   Ketones, ur NEGATIVE  NEGATIVE mg/dL   Protein, ur NEGATIVE  NEGATIVE mg/dL   Urobilinogen, UA 0.2  0.0 - 1.0 mg/dL   Nitrite NEGATIVE  NEGATIVE   Leukocytes, UA SMALL (*) NEGATIVE  URINE MICROSCOPIC-ADD ON     Status: Abnormal   Collection Time    04/14/12  7:24 PM      Result Value Range   Squamous Epithelial / LPF FEW (*) RARE   WBC, UA 3-6  <3 WBC/hpf    Bacteria, UA RARE  RARE   Urine-Other RARE YEAST    LIPASE, BLOOD     Status: None   Collection Time    04/15/12  5:00 AM      Result Value Range   Lipase 47  11 - 59 U/L  AMYLASE     Status: None   Collection Time    04/15/12  5:00 AM      Result Value Range   Amylase 71  0 - 105 U/L    No results found.  ROS negative except above Blood pressure 112/66, pulse 57, temperature 97.6 F (36.4 C), temperature source Oral, resp. rate 20, height 5\' 4"  (1.626 m), weight 53.797 kg (118 lb 9.6 oz), SpO2 97.00%. Physical Exam vital signs stable afebrile no acute distress abdomen is soft nontender good bowel sound lungs clear heart regular rate and rhythm recent workup reviewed labs are okay except for minimal elevated lipase Assessment/Plan: Chronic episodic abdominal pain questionable adhesional Plan: We discussed endoscopy and will proceed today and if nondiagnostic she can probably go home and followup with my partner for further outpatient workup  Gerrard Crystal E 04/15/2012, 1:16 PM

## 2012-04-15 NOTE — Op Note (Signed)
[facility] [address] [place], [address]   ENDOSCOPY PROCEDURE REPORT  PATIENT: Erika Burns, Erika Burns  MR#: [id number] BIRTHDATE: 1965-06-10 , 46  yrs. old GENDER: Female  ENDOSCOPIST: Vida Rigger, MD REFERRED BY:  PROCEDURE DATE:  04/15/2012 PROCEDURE:   EGD, diagnostic ASA CLASS:   Class II INDICATIONS:Epigastric pain and Nausea.  MEDICATIONS: Fentanyl 50 mcg IV and Versed 5 mg IV  TOPICAL ANESTHETIC:used  DESCRIPTION OF PROCEDURE:   After the risks benefits and alternatives of the procedure were thoroughly explained, informed consent was obtained.  The Pentax Gastroscope [id number]  endoscope was introduced through the mouth and advanced to the distal jejunum , limited by Without limitations.   The instrument was slowly withdrawn as the mucosa was fully examined.other than old food in the stomach which to find the anastomosis we had to roll her on her back and  the anastomosis showed 2 widely patent limbs and we advanced a moderately long way down both limbs but no abnormalities were seen except a probable pinpoint native pylorus unfortunately we could not suction some of the food out of the stomach but no other abnormalities were seen air was suctioned scope removed patient tolerated the procedure well there was no obvious immediatecomplication        FINDINGS:1. Old food in the stomach 2 widely patent gastrojejunostomy with both limbs being evaluated 3. Probable pinpoint native pylorus  COMPLICATIONS:none  ENDOSCOPIC IMPRESSION: probable gastroparesis   RECOMMENDATIONS:soft solid diet tear with meats and uncooked vegetables consider further workup as outpatient with possible gastric emptying versus upper GI small bowel followand would try Reglan in the meantime and consider erythromycin in the future and probably okay for close outpatient followup   REPEAT EXAM: as needed   _______________________________ Vida Rigger,  MD eSigned:  Vida Rigger, MD 04/15/2012 1:52 PM    CC:  PATIENT NAME:  Erika Burns, Erika Burns MR#: [id number]

## 2012-04-15 NOTE — Progress Notes (Signed)
Patient able to understand discharge instructions, husband with her to also get info.  Copy given with 2 Rx.  Pt IV left f/a d/c without difficulty.  Pt skin intact, gait steady and able to walk with husband to car for discharge home.   Bonney Leitz RN

## 2012-04-15 NOTE — Progress Notes (Addendum)
PATIENT DETAILS Name: Erika Burns Age: 47 y.o. Sex: female Date of Birth: 12/18/1965 Admit Date: 04/14/2012 Admitting Physician Lars Mage, MD WUJ:WJXBJYN,WGNFAO [name], MD  Subjective: Admitted with abdominal pain. Apparently has been having intermittent abdominal pain for the past 4 months he did  Assessment/Plan: Principal Problem:   Abdominal pain  - Is much better this morning. Lipase was only minimally elevated on admission and has now come back to normal limits, I doubt that this is pancreatitis. -Patient has a significant history of peptic ulcer disease causing gastric outlet obstruction to 3 years ago which required a Billroth II surgery-outpatient CT scan and pelvic ultrasound are essentially unremarkable and did not reveal the cause of this patient's pain. Given her prior history of peptic ulcer disease-I think she needs EGD to make sure she does not have a stomal ulcer etc. that is the culprit here. I have made her n.p.o., I have spoken with [facility], they planned to do a endoscopy later today. Further plan and disposition will depend on the endoscopy results.  Disposition: Remain inpatient  DVT Prophylaxis: Prophylactic Lovenox  Code Status: Full code   Procedures:  None  CONSULTS:  GI  PHYSICAL EXAM: Vital signs in last 24 hours: Filed Vitals:   04/14/12 1720 04/14/12 2037 04/14/12 2100 04/15/12 0500  BP: 117/79 105/65 114/74 112/66  Pulse: 70 61 63 57  Temp: 98.4 F (36.9 C) 97.7 F (36.5 C) 97.9 F (36.6 C) 97.6 F (36.4 C)  TempSrc: Oral     Resp: 16 16 16 20   Height:   5\' 4"  (1.626 m)   Weight:   53.797 kg (118 lb 9.6 oz)   SpO2: 98% 100% 99% 97%    Weight change:  Body mass index is 20.35 kg/(m^2).   Gen Exam: Awake and alert with clear speech.   Neck: Supple, No JVD.   Chest: B/L Clear.   CVS: S1 S2 Regular, no murmurs.  Abdomen: soft, BS +, non tender, non distended.  Extremities: no edema, lower extremities warm to  touch. Neurologic: Non Focal.   Skin: No Rash.  Wounds: N/A.    Intake/Output from previous day:  Intake/Output Summary (Last 24 hours) at 04/15/12 1022 Last data filed at 04/15/12 0600  Gross per 24 hour  Intake   1100 ml  Output      0 ml  Net   1100 ml     LAB RESULTS: CBC  Recent Labs Lab 04/14/12 1725  WBC 9.2  HGB 12.5  HCT 37.5  PLT 417*  MCV 82.4  MCH 27.5  MCHC 33.3  RDW 13.8  LYMPHSABS 1.9  MONOABS 0.4  EOSABS 0.1  BASOSABS 0.0    Chemistries   Recent Labs Lab 04/14/12 1725  NA 138  K 3.8  CL 101  CO2 28  GLUCOSE 103*  BUN 11  CREATININE 0.68  CALCIUM 9.1    CBG: No results found for this basename: GLUCAP,  in the last 168 hours  GFR Estimated Creatinine Clearance: 73 ml/min (by C-G formula based on Cr of 0.68).  Coagulation profile No results found for this basename: INR, PROTIME,  in the last 168 hours  Cardiac Enzymes No results found for this basename: CK, CKMB, TROPONINI, MYOGLOBIN,  in the last 168 hours  No components found with this basename: POCBNP,  No results found for this basename: DDIMER,  in the last 72 hours No results found for this basename: HGBA1C,  in the last 72 hours No results  found for this basename: CHOL, HDL, LDLCALC, TRIG, CHOLHDL, LDLDIRECT,  in the last 72 hours No results found for this basename: TSH, T4TOTAL, FREET3, T3FREE, THYROIDAB,  in the last 72 hours No results found for this basename: VITAMINB12, FOLATE, FERRITIN, TIBC, IRON, RETICCTPCT,  in the last 72 hours  Recent Labs  04/14/12 1725 04/15/12 0500  LIPASE 133* 47  AMYLASE  --  71    Urine Studies No results found for this basename: UACOL, UAPR, USPG, UPH, UTP, UGL, UKET, UBIL, UHGB, UNIT, UROB, ULEU, UEPI, UWBC, URBC, UBAC, CAST, CRYS, UCOM, BILUA,  in the last 72 hours  MICROBIOLOGY: No results found for this or any previous visit (from the past 240 hour(s)).  RADIOLOGY STUDIES/RESULTS: Ct Abdomen Pelvis W Contrast  04/07/2012   *RADIOLOGY REPORT*  Clinical Data: Left lower quadrant abdominal pain and bloating. Some nausea and vomiting.  Previous partial gastrectomy.  CT ABDOMEN AND PELVIS WITH CONTRAST  Technique:  Multidetector CT imaging of the abdomen and pelvis was performed following the standard protocol during bolus administration of intravenous contrast.  Contrast: OMNIPAQUE IOHEXOL 300 MG/ML  SOLN  Comparison: None.  Findings: Evidence of Billroth II anastomosis.  The liver, spleen, pancreas, adrenal glands, and kidneys are normal except for a 7 mm cyst in the lower pole of the right kidney.  Biliary tree is normal.  There are no dilated loops of large or small bowel.  The cecum lies low in the right side of the pelvis.  Normal appendix. Uterus and ovaries are normal except for a 19 mm cyst on the left ovary.  No free air or free fluid.  No osseous abnormality.  IMPRESSION: Benign-appearing abdomen and pelvis.  Small cysts in the right kidney and on the left ovary.   Original Report Authenticated By: Francene Boyers, M.D.     MEDICATIONS: Scheduled Meds: . [COMPLETED] sodium chloride   Intravenous STAT  . enoxaparin (LOVENOX) injection  40 mg Subcutaneous Q24H   Continuous Infusions:  PRN Meds:.  Antibiotics: Anti-infectives   None       Jeoffrey Massed, MD  [facility] Pager:[phone]  If 7PM-7AM, please contact night-coverage [email or web address] Password [email or web address] 04/15/2012, 10:22 AM   LOS: 1 day

## 2012-04-17 ENCOUNTER — Encounter (HOSPITAL_COMMUNITY): Payer: Self-pay | Admitting: Gastroenterology

## 2012-06-10 ENCOUNTER — Other Ambulatory Visit: Payer: Self-pay | Admitting: Internal Medicine

## 2012-06-13 NOTE — Telephone Encounter (Signed)
Patient was seen by dr in hospital Needs an appt

## 2012-11-16 ENCOUNTER — Other Ambulatory Visit: Payer: Self-pay | Admitting: Obstetrics and Gynecology

## 2012-11-16 DIAGNOSIS — N6002 Solitary cyst of left breast: Secondary | ICD-10-CM

## 2012-12-04 ENCOUNTER — Ambulatory Visit
Admission: RE | Admit: 2012-12-04 | Discharge: 2012-12-04 | Disposition: A | Discharge status: Home / Self Care | Payer: 59 | Source: Ambulatory Visit | Attending: Obstetrics and Gynecology | Admitting: Obstetrics and Gynecology

## 2012-12-04 ENCOUNTER — Other Ambulatory Visit: Payer: Self-pay | Admitting: Obstetrics and Gynecology

## 2012-12-04 DIAGNOSIS — N6002 Solitary cyst of left breast: Secondary | ICD-10-CM

## 2013-09-07 ENCOUNTER — Other Ambulatory Visit: Payer: Self-pay | Admitting: Obstetrics and Gynecology

## 2013-09-10 LAB — CYTOLOGY - PAP

## 2015-02-06 IMAGING — MG MM DIGITAL DIAGNOSTIC BILAT
8 series · 8 of 8 positions shown · non-contrast
Comparison: With priors

CLINICAL DATA: The patient had a left breast cyst aspiration
05/10/2011. She did not return for a short-term interval followup
study for probable benign cysts in the left breast.

EXAM:
DIGITAL DIAGNOSTIC  BILATERAL MAMMOGRAM WITH CAD
ULTRASOUND BILATERAL BREAST

[L MLO (1 of 2)]
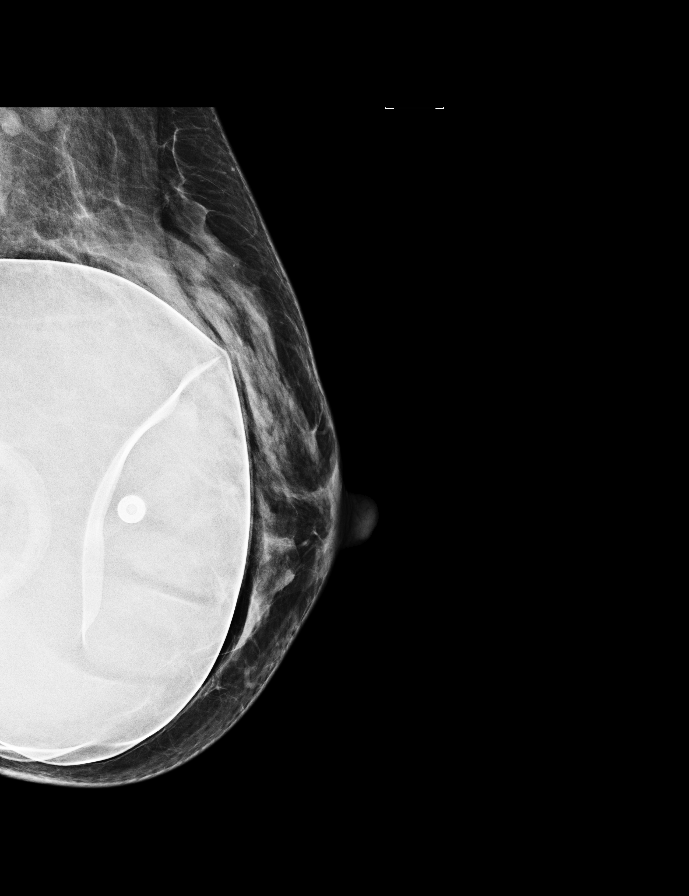

[L MLO (2 of 2)]
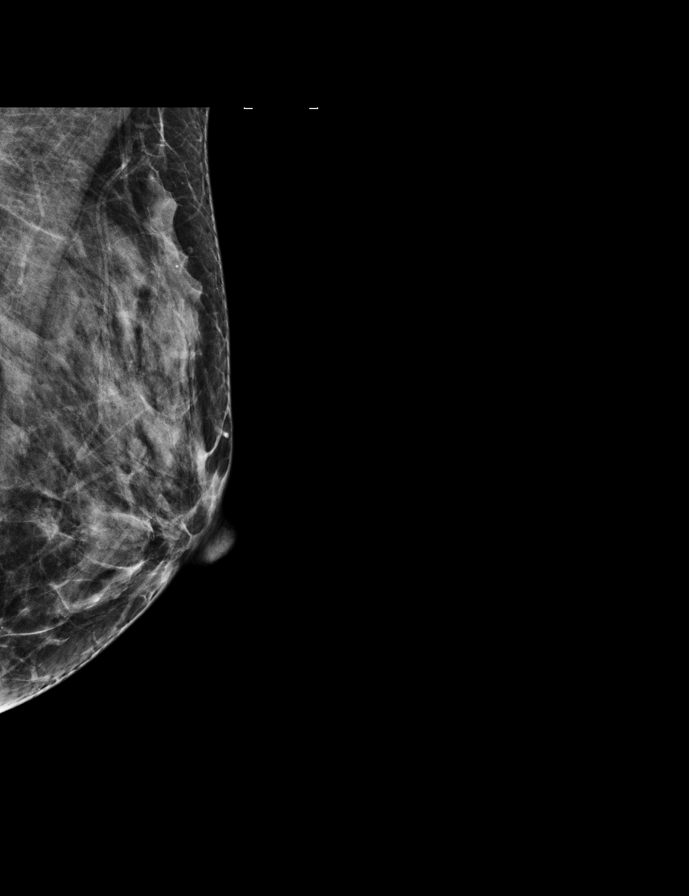

[R MLO (1 of 2)]
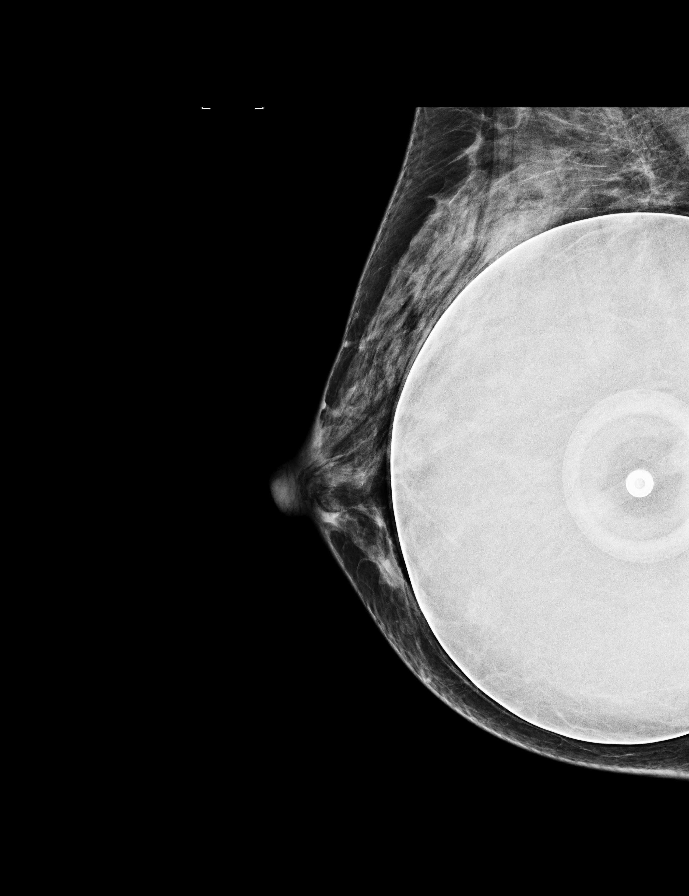

[L CC (1 of 2)]
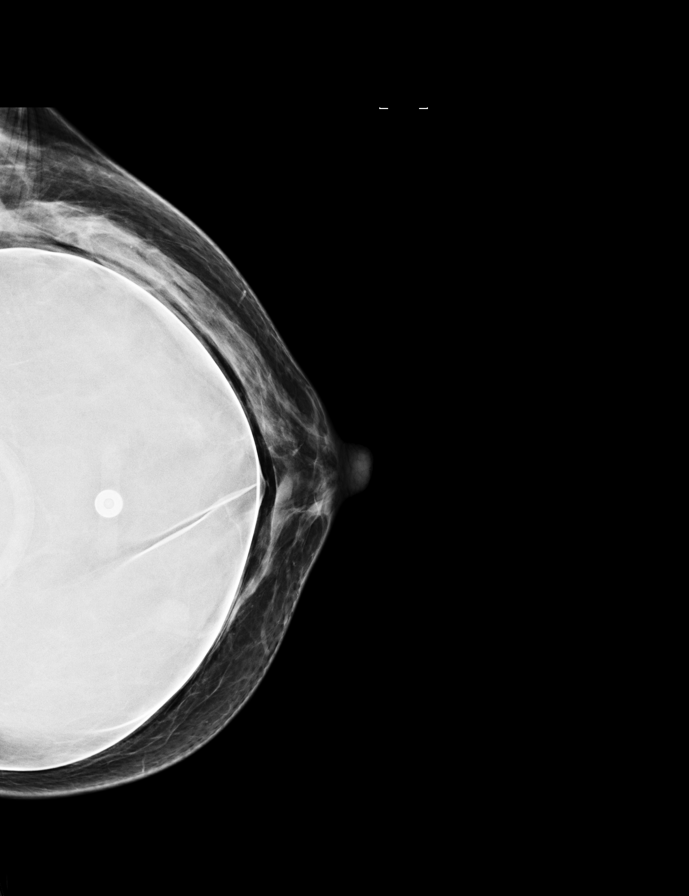

[R CC (1 of 2)]
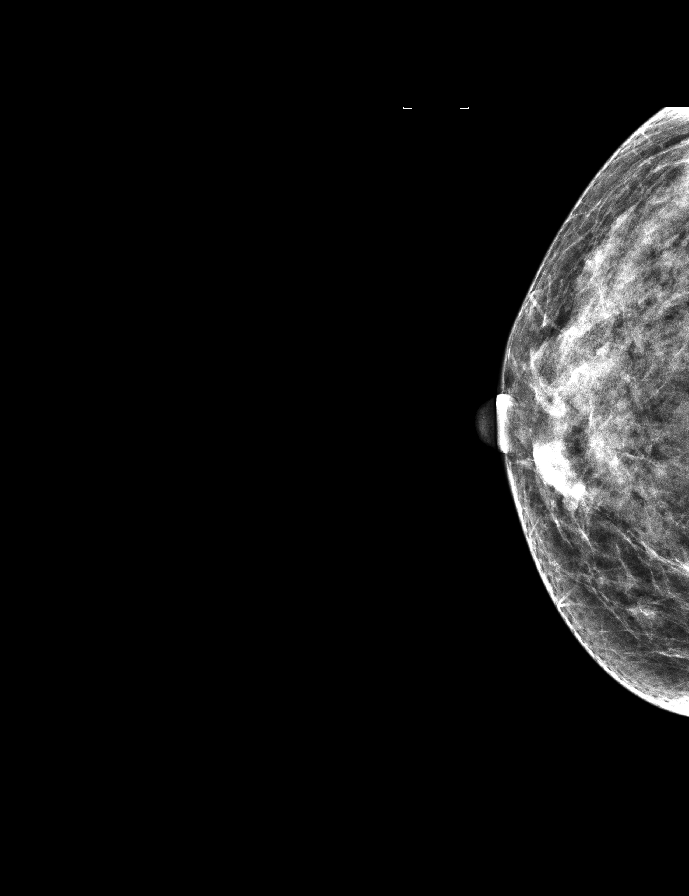

[R MLO (2 of 2)]
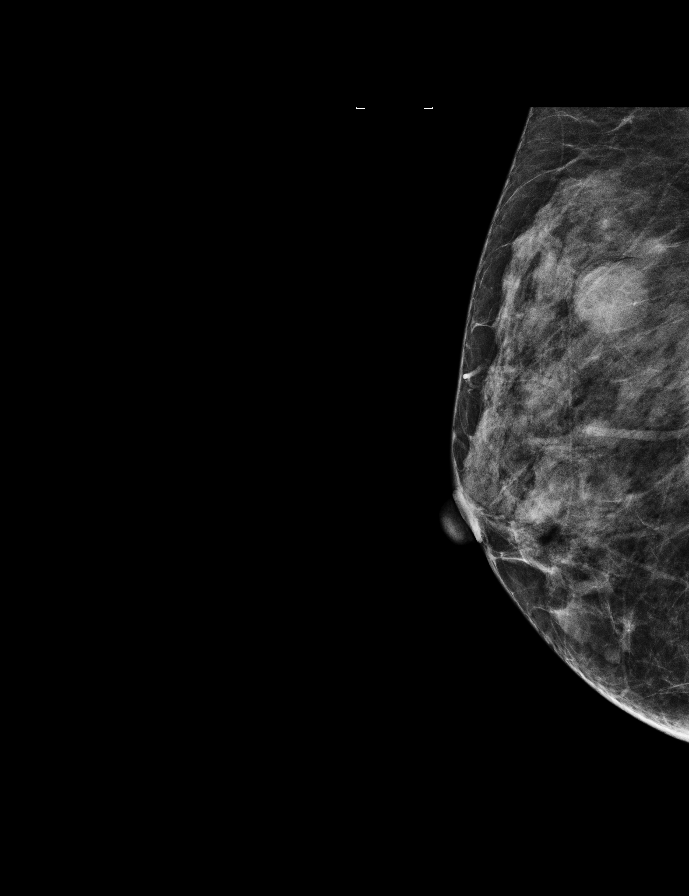

[R CC (2 of 2)]
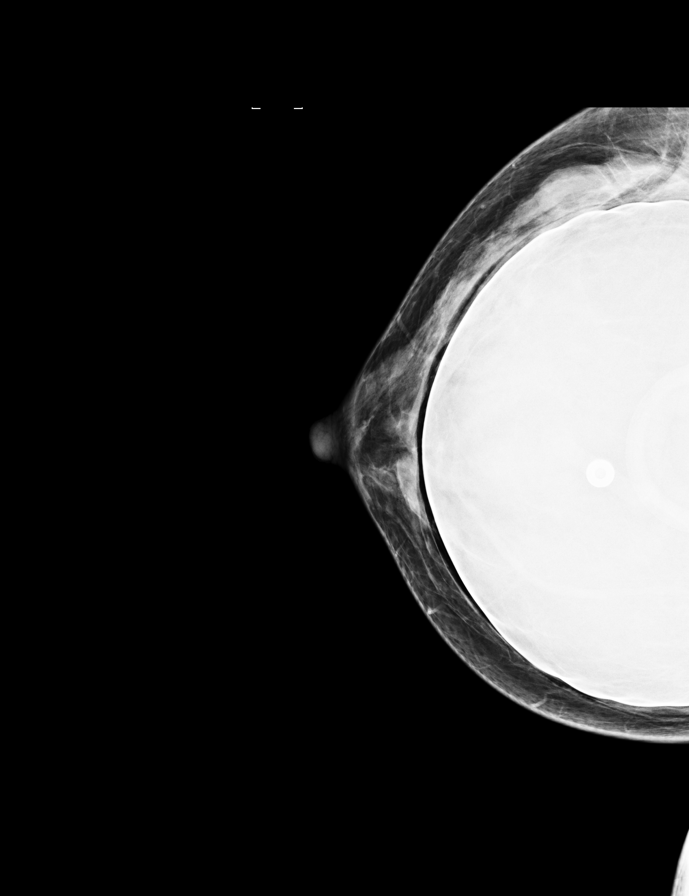

[L CC (2 of 2)]
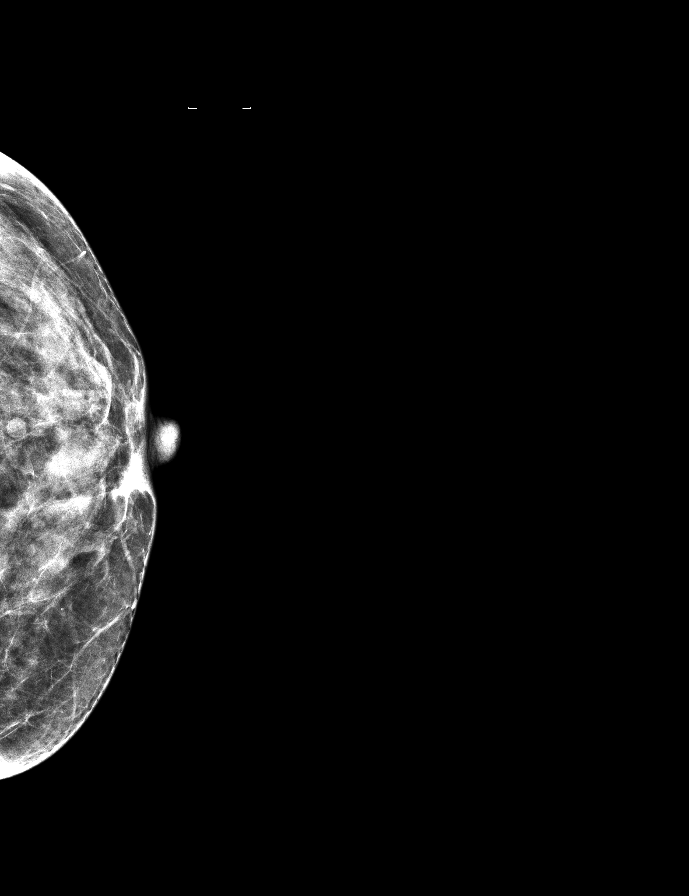

[8 of 8 positions shown; findings below may reference images not displayed]

ACR Breast Density Category c: The breasts are heterogeneously
dense, which may obscure small masses.
FINDINGS: Standard and push back views were obtained. The patient has
bilateral subpectoral implants. There is a developing
well-circumscribed 1.8 cm mass in the upper-outer quadrant of the
right breast. No suspicious mass or malignant type
microcalcifications are seen in the left breast.

Mammographic images were processed with CAD.

On physical exam,I do not palpate a mass in either breast..

Ultrasound is performed, showing a cyst in the right breast at 10
o'clock 7 cm from the nipple measuring 1.7 x 1.0 x 1.8 cm. There are
3 cysts in the left breast. There are 2 adjacent cyst in the left
breast at 12 o'clock 4 cm from the nipple measuring 5 x 5 x 5 mm and
3 x 2 x 4 mm. There is a cyst in the left breast 11 o'clock 5 cm
from the nipple measuring 8 x 5 x 8 mm. Internal debris is seen in
the cysts.
IMPRESSION: No evidence of malignancy in either breast.

RECOMMENDATION:
Bilateral screening mammogram in 1 year is recommended.

I have discussed the findings and recommendations with the patient.
Results were also provided in writing at the conclusion of the
visit.

BI-RADS CATEGORY  2: Benign Finding(s)

## 2019-03-21 ENCOUNTER — Other Ambulatory Visit: Payer: Self-pay | Admitting: Family Medicine

## 2019-03-21 DIAGNOSIS — Z1231 Encounter for screening mammogram for malignant neoplasm of breast: Secondary | ICD-10-CM

## 2021-07-29 LAB — HM MAMMOGRAPHY

## 2021-07-29 LAB — HM PAP SMEAR

## 2021-11-16 ENCOUNTER — Encounter: Payer: Self-pay | Admitting: Family

## 2021-11-16 ENCOUNTER — Ambulatory Visit (INDEPENDENT_AMBULATORY_CARE_PROVIDER_SITE_OTHER): Payer: Self-pay | Admitting: Family

## 2021-11-16 VITALS — BP 137/84 | HR 61 | Temp 97.6°F | Ht 63.5 in | Wt 126.4 lb

## 2021-11-16 DIAGNOSIS — F411 Generalized anxiety disorder: Secondary | ICD-10-CM

## 2021-11-16 DIAGNOSIS — Z23 Encounter for immunization: Secondary | ICD-10-CM

## 2021-11-16 DIAGNOSIS — M542 Cervicalgia: Secondary | ICD-10-CM

## 2021-11-16 MED ORDER — CYCLOBENZAPRINE HCL 5 MG PO TABS: 5.0000 mg | ORAL_TABLET | Freq: Three times a day (TID) | ORAL | 1 refills | Status: DC | PRN

## 2021-11-16 MED ORDER — SERTRALINE HCL 50 MG PO TABS: 150.0000 mg | ORAL_TABLET | ORAL | 1 refills | Status: DC

## 2021-11-16 NOTE — Progress Notes (Signed)
New Patient Office Visit  Subjective:  Patient ID: Erika Burns, female    DOB: [birth year]  Age: 56 y.o. MRN: [id number]  CC:  Chief Complaint  Patient presents with  . Establish Care  . Anxiety    Discuss medications    HPI Erika Burns presents for establishing care today.  Anxiety:   pt reports taking Zoloft up until last year, up to 150mg , but made her very sleepy &  switched to Celexa and it made her feel worse, more tense. 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  Neck pain:     Assessment & Plan:   Problem List Items Addressed This Visit   None Visit Diagnoses     Generalized anxiety disorder    -  Primary   Relevant Medications   sertraline (ZOLOFT) 50 MG tablet   Need for immunization against influenza       Relevant Orders   Flu Vaccine QUAD 6+ mos PF IM (Fluarix Quad PF)   Cervicalgia       Relevant Medications   cyclobenzaprine (FLEXERIL) 5 MG tablet       Subjective:    Outpatient Medications Prior to Visit  Medication Sig Dispense Refill  . Ascorbic Acid (VITAMIN C) 100 MG tablet Take 100 mg by mouth daily.    . Cholecalciferol (VITAMIN D3) 1.25 MG (50000 UT) CAPS Take by mouth.    . ibandronate (BONIVA) 150 MG tablet Take 150 mg by mouth every 30 (thirty) days.    Marland Kitchen MAGNESIUM OXIDE PO Take by mouth.    . Omeprazole Magnesium 10 MG PACK Take 10 mg by mouth daily.    . sertraline (ZOLOFT) 50 MG tablet Take 1 tablet by mouth daily.    . metoCLOPramide (REGLAN) 5 MG tablet Take 1 tablet (5 mg total) by mouth 3 (three) times daily before meals. (Patient not taking: Reported on 11/16/2021) 90 tablet 0  . pantoprazole (PROTONIX) 40 MG tablet Take 1 tablet (40 mg total) by mouth daily at 12 noon. (Patient not taking: Reported on 11/16/2021) 30 tablet 0   No facility-administered medications prior to visit.   Past Medical History:  Diagnosis Date  . Bowel obstruction [facility])    Past Surgical History:  Procedure Laterality  Date  . ABDOMINAL SURGERY  2005   repair of bowel obstruction  . BREAST SURGERY  2004   breast implants  . ESOPHAGOGASTRODUODENOSCOPY N/A 04/15/2012   Procedure: ESOPHAGOGASTRODUODENOSCOPY (EGD);  Surgeon: Jeryl Columbia, MD;  Location: [facility] ENDOSCOPY;  Service: Endoscopy;  Laterality: N/A;    Objective:   Today's Vitals: BP 137/84 (BP Location: Left Arm, Patient Position: Sitting, Cuff Size: Large)   Pulse 61   Temp 97.6 F (36.4 C) (Temporal)   Ht 5' 3.5" (1.613 m)   Wt 126 lb 6.4 oz (57.3 kg)   SpO2 99%   BMI 22.04 kg/m   Physical Exam  Meds ordered this encounter  Medications  . sertraline (ZOLOFT) 50 MG tablet    Sig: Take 3 tablets (150 mg total) by mouth as directed. Take 2 tabs qhs, 1 tab qam    Dispense:  90 tablet    Refill:  1    Order Specific Question:   Supervising Provider    Answer:   ANDY, CAMILLE L [[id number]]  . cyclobenzaprine (FLEXERIL) 5 MG tablet    Sig: Take 1-2 tablets (5-10 mg total) by mouth 3 (three) times daily as needed  for muscle spasms.    Dispense:  30 tablet    Refill:  1    Order Specific Question:   Supervising Provider    Answer:   ANDY, CAMILLE L [[id number]]    Dulce Sellar, NP

## 2021-11-16 NOTE — Patient Instructions (Addendum)
Welcome to [facility], It was a pleasure meeting you today!    As discussed, I have sent your generic Zoloft and the generic Flexeril to your pharmacy.  You can schedule a physical with fasting labs as soon as convenient for you - ok to do labs a few days prior to the visit.  Please schedule a 6 month follow up visit today for follow up for Anxiety.    PLEASE NOTE: If you had any LAB tests please let us know if you have not heard back within a few days. You may see your results on MyChart before we have a chance to review them but we will give you a call once they are reviewed by [place]. If we ordered any REFERRALS today, please let us know if you have not heard from their office within the next week.  Let us know through MyChart if you are needing REFILLS, or have your pharmacy send [place] the request. You can also use MyChart to communicate with me or any office staff.

## 2021-11-17 ENCOUNTER — Encounter: Payer: Self-pay | Admitting: Family

## 2021-11-17 DIAGNOSIS — F411 Generalized anxiety disorder: Secondary | ICD-10-CM | POA: Insufficient documentation

## 2021-11-17 NOTE — Assessment & Plan Note (Signed)
   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

## 2021-11-30 ENCOUNTER — Encounter: Payer: Self-pay | Admitting: Family

## 2021-11-30 ENCOUNTER — Ambulatory Visit (INDEPENDENT_AMBULATORY_CARE_PROVIDER_SITE_OTHER): Payer: Self-pay | Admitting: Family

## 2021-11-30 VITALS — BP 137/78 | HR 62 | Temp 97.7°F | Ht 63.5 in | Wt 127.2 lb

## 2021-11-30 DIAGNOSIS — Z0000 Encounter for general adult medical examination without abnormal findings: Secondary | ICD-10-CM

## 2021-11-30 DIAGNOSIS — E782 Mixed hyperlipidemia: Secondary | ICD-10-CM

## 2021-11-30 DIAGNOSIS — M810 Age-related osteoporosis without current pathological fracture: Secondary | ICD-10-CM | POA: Insufficient documentation

## 2021-11-30 LAB — COMPREHENSIVE METABOLIC PANEL
ALT: 26 U/L (ref 0–35)
AST: 23 U/L (ref 0–37)
Albumin: 4.5 g/dL (ref 3.5–5.2)
Alkaline Phosphatase: 61 U/L (ref 39–117)
BUN: 14 mg/dL (ref 6–23)
CO2: 31 mEq/L (ref 19–32)
Calcium: 9 mg/dL (ref 8.4–10.5)
Chloride: 100 mEq/L (ref 96–112)
Creatinine, Ser: 0.73 mg/dL (ref 0.40–1.20)
GFR: 90.75 mL/min (ref >60.00)
Glucose, Bld: 92 mg/dL (ref 70–99)
Potassium: 4.1 mEq/L (ref 3.5–5.1)
Sodium: 137 mEq/L (ref 135–145)
Total Bilirubin: 0.3 mg/dL (ref 0.2–1.2)
Total Protein: 6.9 g/dL (ref 6.0–8.3)

## 2021-11-30 LAB — LIPID PANEL
Cholesterol: 215 mg/dL — ABNORMAL HIGH (ref 0–200)
HDL: 65 mg/dL (ref >39.00)
LDL Cholesterol: 128 mg/dL — ABNORMAL HIGH (ref 0–99)
NonHDL: 149.62
Total CHOL/HDL Ratio: 3
Triglycerides: 109 mg/dL (ref 0.0–149.0)
VLDL: 21.8 mg/dL (ref 0.0–40.0)

## 2021-11-30 LAB — CBC WITH DIFFERENTIAL/PLATELET
Basophils Absolute: 0 10*3/uL (ref 0.0–0.1)
Basophils Relative: 0.4 % (ref 0.0–3.0)
Eosinophils Absolute: 0 10*3/uL (ref 0.0–0.7)
Eosinophils Relative: 0.7 % (ref 0.0–5.0)
HCT: 38.1 % (ref 36.0–46.0)
Hemoglobin: 13.1 g/dL (ref 12.0–15.0)
Lymphocytes Relative: 31.4 % (ref 12.0–46.0)
Lymphs Abs: 2 10*3/uL (ref 0.7–4.0)
MCHC: 34.4 g/dL (ref 30.0–36.0)
MCV: 84.9 fl (ref 78.0–100.0)
Monocytes Absolute: 0.4 10*3/uL (ref 0.1–1.0)
Monocytes Relative: 6.8 % (ref 3.0–12.0)
Neutro Abs: 3.9 10*3/uL (ref 1.4–7.7)
Neutrophils Relative %: 60.7 % (ref 43.0–77.0)
Platelets: 374 10*3/uL (ref 150.0–400.0)
RBC: 4.49 Mil/uL (ref 3.87–5.11)
RDW: 12.9 % (ref 11.5–15.5)
WBC: 6.4 10*3/uL (ref 4.0–10.5)

## 2021-11-30 NOTE — Patient Instructions (Signed)
It was very nice to see you today!   I will review your lab results via MyChart in a few days.   Have a great week!    PLEASE NOTE:  If you had any lab tests please let us know if you have not heard back within a few days. You may see your results on MyChart before we have a chance to review them but we will give you a call once they are reviewed by us. If we ordered any referrals today, please let us know if you have not heard from their office within the next week.    

## 2021-11-30 NOTE — Assessment & Plan Note (Signed)
per DEXA scan done w/GYN in 2023 - Osteoporosis in right hip & Osteopenia in left hip - started [facility]

## 2021-11-30 NOTE — Progress Notes (Signed)
Phone [phone]  Subjective:   Patient is a 56 y.o. female presenting for annual physical.    Chief Complaint  Patient presents with   Annual Exam    Pt has no questions or concerns     See problem oriented charting- ROS- full  review of systems was completed and negative.   The following were reviewed and entered/updated in epic: Past Medical History:  Diagnosis Date   Abdominal pain in female patient 04/14/2012   Bowel obstruction [facility])    Osteoporosis    Patient Active Problem List   Diagnosis Date Noted   Age-related osteoporosis without current pathological fracture 11/30/2021   Generalized anxiety disorder 11/17/2021   Past Surgical History:  Procedure Laterality Date   ABDOMINAL SURGERY  2005   repair of bowel obstruction   BREAST SURGERY  2004   breast implants   ESOPHAGOGASTRODUODENOSCOPY N/A 04/15/2012   Procedure: ESOPHAGOGASTRODUODENOSCOPY (EGD);  Surgeon: Jeryl Columbia, MD;  Location: [facility] ENDOSCOPY;  Service: Endoscopy;  Laterality: N/A;    Family History  Problem Relation Age of Onset   Heart disease Mother    Depression Mother    Hypertension Father    Diabetes Father    Depression Father    Heart disease Brother    Learning disabilities Son     Medications- reviewed and updated Current Outpatient Medications  Medication Sig Dispense Refill   Ascorbic Acid (VITAMIN C) 100 MG tablet Take 100 mg by mouth daily.     Cholecalciferol (VITAMIN D3) 1.25 MG (50000 UT) CAPS Take by mouth.     cyclobenzaprine (FLEXERIL) 5 MG tablet Take 1-2 tablets (5-10 mg total) by mouth 3 (three) times daily as needed for muscle spasms. 30 tablet 1   ibandronate (BONIVA) 150 MG tablet Take 150 mg by mouth every 30 (thirty) days.     MAGNESIUM OXIDE PO Take by mouth.     Omeprazole Magnesium 10 MG PACK Take 10 mg by mouth daily.     sertraline (ZOLOFT) 50 MG tablet Take 3 tablets (150 mg total) by mouth as directed. Take 2 tabs qhs, 1 tab qam 90 tablet 1   No  current facility-administered medications for this visit.    Allergies-reviewed and updated No Known Allergies  Social History   Social History Narrative   Not on file    Objective:  BP 137/78 (BP Location: Left Arm, Patient Position: Sitting)   Pulse 62   Temp 97.7 F (36.5 C) (Temporal)   Ht 5' 3.5" (1.613 m)   Wt 127 lb 3.2 oz (57.7 kg)   SpO2 98%   BMI 22.18 kg/m  Physical Exam Vitals and nursing note reviewed.  Constitutional:      Appearance: Normal appearance.  HENT:     Head: Normocephalic.     Right Ear: Tympanic membrane normal.     Left Ear: Tympanic membrane normal.     Nose: Nose normal.     Mouth/Throat:     Mouth: Mucous membranes are moist.  Eyes:     Pupils: Pupils are equal, round, and reactive to light.  Cardiovascular:     Rate and Rhythm: Normal rate and regular rhythm.  Pulmonary:     Effort: Pulmonary effort is normal.     Breath sounds: Normal breath sounds.  Musculoskeletal:        General: Normal range of motion.     Cervical back: Normal range of motion.  Lymphadenopathy:     Cervical: No cervical adenopathy.  Skin:  General: Skin is warm and dry.  Neurological:     Mental Status: She is alert.  Psychiatric:        Mood and Affect: Mood normal.        Behavior: Behavior normal.     Assessment and Plan   Health Maintenance counseling: 1. Anticipatory guidance: Patient counseled regarding regular dental exams q6 months, eye exams,  avoiding smoking and second hand smoke, limiting alcohol to 1 beverage per day, no illicit drugs.   2. Risk factor reduction:  Advised patient of need for regular exercise and diet rich with fruits and vegetables to reduce risk of heart attack and stroke. Exercise- none currently.  Wt Readings from Last 3 Encounters:  11/30/21 127 lb 3.2 oz (57.7 kg)  11/16/21 126 lb 6.4 oz (57.3 kg)  04/14/12 118 lb 9.6 oz (53.8 kg)   3. Immunizations/screenings/ancillary studies Immunization History   Administered Date(s) Administered   Influenza-Unspecified 11/19/2019   PFIZER(Purple Top)SARS-COV-2 Vaccination 04/27/2019   Pfizer Covid-19 Vaccine Bivalent Booster 6yr & up 02/11/2020   Unspecified SARS-COV-2 Vaccination 06/07/2019   Zoster, Live 12/19/2019   Health Maintenance Due  Topic Date Due   Zoster Vaccines- Shingrix (1 of 2) Never done    4. Cervical cancer screening- due 03/2024 5. Breast cancer screening-  mammogram: normal, due 07/2023 6. Colon cancer screening - due 03/2022 7. Skin cancer screening- advised regular sunscreen use. Denies worrisome, changing, or new skin lesions.  8. Birth control/STD check- N/A 9. Osteoporosis screening-  done this year, Osteoporosis right hip, Osteopenia in left hip, taking Boniva qmonthly 10. Alcohol screening: 2 drinks/week 11. Smoking associated screening (lung cancer screening, AAA screen 65-75, UA)- non- smoker  Problem List Items Addressed This Visit       Musculoskeletal and Integument   Age-related osteoporosis without current pathological fracture    per DEXA scan done w/GYN in 2023 - Osteoporosis in right hip & Osteopenia in left hip - started [facility]     Other Visit Diagnoses     Annual physical exam    -  Primary   Relevant Orders   Comp Met (CMET)   CBC with Differential/Platelet   Mixed hyperlipidemia       Relevant Orders   Lipid panel      Recommended follow up: Return for any future concerns. Future Appointments  Date Time [place] 05/17/2022 10:00 AM HJeanie Sewer NP [facility]    Lab/Order associations: fasting   HJeanie Sewer NP

## 2021-12-07 NOTE — Progress Notes (Signed)
Your glucose, electrolytes, blood count, thyroid, liver & kidney function are all normal.  Your total cholesterol number & LDL (bad #) are high. Need to reduce any fried foods, alcohol, nonnutritional snacks e.g. chips/cookies,pies, cakes and candies, fatty meat (red meat), high fat dairy foods:  including cheese, milk, ice cream.  Increase fruits/vegetables/fiber.   Continue or restart an exercise routine, shooting for 30min 5-7days per week.     

## 2022-01-06 NOTE — Progress Notes (Signed)
Erika Burns D.[facility] [address] [place] [address] Phone: [phone]   Assessment and Plan:     1. Right hip pain 2. Age-related osteoporosis without current pathological fracture 3. Primary osteoarthritis of right hip -Chronic with exacerbation, initial sports medicine visit - Most consistent with osteoarthritis of right hip with intermittent flares - Of note, patient started Boniva 4 months ago.  No clear intertrochanteric or femoral fracture on x-rays today.  Will await final radiology read.  Hip pain has been present since before starting Boniva. - Start meloxicam 15 mg daily x2 weeks.  If still having pain after 2 weeks, complete 3rd-week of meloxicam. May use remaining meloxicam as needed once daily for pain control.  Do not to use additional NSAIDs while taking meloxicam.  May use Tylenol [phone] mg 2 to 3 times a day for breakthrough pain. - Start HEP and physical therapy for hip - X-ray obtained in clinic.  My interpretation: No acute fracture or dislocation.  Moderate degenerative changes in right femoral acetabular joint with decreased space, femoral head spurring, acetabular spurring, femoral head cortical changes and acetabular sclerosis  Other orders - meloxicam (MOBIC) 15 MG tablet; Take 1 tablet (15 mg total) by mouth daily.    Pertinent previous records reviewed include failure medicine note 11/30/2021   Follow Up: 4 weeks for reevaluation.  If no improvement or worsening of symptoms, could consider advanced imaging versus intra-articular CSI   Subjective:   I, Erika Burns, am serving as a Neurosurgeon for Doctor Richardean Sale  Chief Complaint: hip pain   HPI:   01/07/2022 Patient is a 56 year old female complaining of hip pain. Patient states intermittent pain for a year , right hip pain , when she abducts she has sharp pain, states she has osteoporosis of the right hip takes boniva 1 month has been on 4 months  now , at night she gets radiating pain down her leg ,no pain in flexion , ib for the pain and that seems to helps a little , no numbness or tingling, has flares that will last for a little bit of time    Relevant Historical Information: Osteoporosis on Boniva  Additional pertinent review of systems negative.   Current Outpatient Medications:    meloxicam (MOBIC) 15 MG tablet, Take 1 tablet (15 mg total) by mouth daily., Disp: 30 tablet, Rfl: 0   Ascorbic Acid (VITAMIN C) 100 MG tablet, Take 100 mg by mouth daily., Disp: , Rfl:    Cholecalciferol (VITAMIN D3) 1.25 MG (50000 UT) CAPS, Take by mouth., Disp: , Rfl:    cyclobenzaprine (FLEXERIL) 5 MG tablet, Take 1-2 tablets (5-10 mg total) by mouth 3 (three) times daily as needed for muscle spasms., Disp: 30 tablet, Rfl: 1   ibandronate (BONIVA) 150 MG tablet, Take 150 mg by mouth every 30 (thirty) days., Disp: , Rfl:    MAGNESIUM OXIDE PO, Take by mouth., Disp: , Rfl:    Omeprazole Magnesium 10 MG PACK, Take 10 mg by mouth daily., Disp: , Rfl:    sertraline (ZOLOFT) 50 MG tablet, Take 3 tablets (150 mg total) by mouth as directed. Take 2 tabs qhs, 1 tab qam, Disp: 90 tablet, Rfl: 1   Objective:     Vitals:   01/07/22 0925  BP: 110/80  Weight: 128 lb (58.1 kg)  Height: 5\' 3"  (1.6 m)      Body mass index is 22.67 kg/m.    Physical Exam:  General: awake, alert, and oriented no acute distress, nontoxic Skin: no suspicious lesions or rashes Neuro:sensation intact distally with no dificits, normal muscle tone, no atrophy, strength 5/5 in all tested lower ext groups Psych: normal mood and affect, speech clear  Right hip: No deformity, swelling or wasting ROM Flexion 90, ext 20, IR 30, ER 35 NTTP over the hip flexors, greater trochanter, gluteal musculature, si joint, lumbar spine Negative log roll with FROM Positive FABER Positive FADIR Negative Piriformis test, though mild general discomfort that was not present on left  side Negative trendelenberg Gait normal    Electronically signed by:  Erika Burns D.[facility] 10:10 AM 01/07/22

## 2022-01-07 ENCOUNTER — Ambulatory Visit (INDEPENDENT_AMBULATORY_CARE_PROVIDER_SITE_OTHER): Payer: Self-pay

## 2022-01-07 ENCOUNTER — Ambulatory Visit (INDEPENDENT_AMBULATORY_CARE_PROVIDER_SITE_OTHER): Payer: Self-pay | Admitting: Sports Medicine

## 2022-01-07 VITALS — BP 110/80 | Ht 63.0 in | Wt 128.0 lb

## 2022-01-07 DIAGNOSIS — M25551 Pain in right hip: Secondary | ICD-10-CM

## 2022-01-07 DIAGNOSIS — M810 Age-related osteoporosis without current pathological fracture: Secondary | ICD-10-CM

## 2022-01-07 DIAGNOSIS — M1611 Unilateral primary osteoarthritis, right hip: Secondary | ICD-10-CM

## 2022-01-07 MED ORDER — MELOXICAM 15 MG PO TABS: 15.0000 mg | ORAL_TABLET | Freq: Every day | ORAL | 0 refills | Status: DC

## 2022-01-07 NOTE — Patient Instructions (Addendum)
Good to see you  - Start meloxicam 15 mg daily x2 weeks.  If still having pain after 2 weeks, complete 3rd-week of meloxicam. May use remaining meloxicam as needed once daily for pain control.  Do not to use additional NSAIDs while taking meloxicam.  May use Tylenol [phone] mg 2 to 3 times a day for breakthrough pain. Hip HEP  PT referral  4 week follow up

## 2022-01-17 ENCOUNTER — Other Ambulatory Visit: Payer: Self-pay | Admitting: Family

## 2022-01-17 DIAGNOSIS — F411 Generalized anxiety disorder: Secondary | ICD-10-CM

## 2022-04-03 ENCOUNTER — Other Ambulatory Visit: Payer: Self-pay | Admitting: Family

## 2022-04-03 DIAGNOSIS — F411 Generalized anxiety disorder: Secondary | ICD-10-CM

## 2022-05-17 ENCOUNTER — Encounter: Payer: Self-pay | Admitting: Family

## 2022-05-17 ENCOUNTER — Ambulatory Visit (INDEPENDENT_AMBULATORY_CARE_PROVIDER_SITE_OTHER): Payer: Self-pay | Admitting: Family

## 2022-05-17 VITALS — BP 124/70 | HR 79 | Temp 97.0°F | Ht 63.0 in | Wt 126.4 lb

## 2022-05-17 DIAGNOSIS — M542 Cervicalgia: Secondary | ICD-10-CM | POA: Insufficient documentation

## 2022-05-17 DIAGNOSIS — F411 Generalized anxiety disorder: Secondary | ICD-10-CM

## 2022-05-17 MED ORDER — BUSPIRONE HCL 5 MG PO TABS: 5.0000 mg | ORAL_TABLET | Freq: Two times a day (BID) | ORAL | 0 refills | Status: DC

## 2022-05-17 MED ORDER — CYCLOBENZAPRINE HCL 5 MG PO TABS: 5.0000 mg | ORAL_TABLET | Freq: Three times a day (TID) | ORAL | 2 refills | Status: DC | PRN

## 2022-05-17 MED ORDER — CLONAZEPAM 0.25 MG PO TBDP: 0.2500 mg | ORAL_TABLET | Freq: Every day | ORAL | 0 refills | Status: DC | PRN

## 2022-05-17 NOTE — Patient Instructions (Signed)
It was very nice to see you today!   I have sent over 2 new meds for your anxiety, take as directed on the bottles.  Call our counseling offices to schedule with a therapist - [phone] [facility]) and [facility] is [phone].  Schedule a one month follow up visit with me.     PLEASE NOTE:  If you had any lab tests please let us know if you have not heard back within a few days. You may see your results on MyChart before we have a chance to review them but we will give you a call once they are reviewed by [place]. If we ordered any referrals today, please let us know if you have not heard from their office within the next week.

## 2022-05-17 NOTE — Assessment & Plan Note (Addendum)
chronic pt is FT cg to her adult son with mental illness reports new stress w/dtr having postpartum depression  pt hesitant to start another anti-depressant discussed meds & need for therapy sending referral, starting Buspar (husb takes and likes this), also giving Klonopin low dose, prn only, advised on use & SE of both, PDMP checked/verified, controlled substance contract signed f/u in 1 mos or prn

## 2022-05-17 NOTE — Progress Notes (Signed)
Patient ID: Erika Burns, female    DOB: Apr 12, 1965, 57 y.o.   MRN: [id number]  Chief Complaint  Patient presents with   Anxiety    Wants to switch sertraline    HPI: Neck pain:  pt holds a lot of tension in her neck and trapezius area, has tried analgesic rubs, tylenol & NSAIDs with mild relief. Denies any neuropathies or radiating pain down her either arm. Given Flexeril last visit and states this has helped her pain, would like a refill.  Anxiety:   pt reports taking Zoloft up until last year, up to 150mg , but made her very sleepy &  switched to Celexa and it made her feel worse, more tense. 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 Recently restarted 50mg  tabs she had left over. *Update - pt reports increasing zoloft but was not working for her so she stopped taking, has since had more stress r/t her dtr having postpartum depression; she has taken Lexapro in past that also didn't work w/old PCP.  Assessment & Plan:  Generalized anxiety disorder Assessment & Plan: chronic pt is FT cg to her adult son with mental illness reports new stress w/dtr having postpartum depression  pt hesitant to start another anti-depressant discussed meds & need for therapy sending referral, starting Buspar (husb takes and likes this), also giving Klonopin low dose, prn only, advised on use & SE of both, PDMP checked/verified, controlled substance contract signed f/u in 1 mos or prn   Orders: -     busPIRone HCl; Take 1-2 tablets (5-10 mg total) by mouth 2 (two) times daily.  Dispense: 60 tablet; Refill: 0 -     clonazePAM; Take 1-2 tablets (0.25-0.5 mg total) by mouth daily as needed (Severe anxiety).  Dispense: 20 tablet; Refill: 0 -     Ambulatory referral to Psychology  Cervicalgia Assessment & Plan: chronic most trapezius muscle spams due to tension/stress reports Flexeril worked well, no SE continue to advise on stress reduction, use of heat tid prn, neck  exercises/stretches throughout the day refilling Flexeril f/u 6 mos or prn  Orders: -     Cyclobenzaprine HCl; Take 1-2 tablets (5-10 mg total) by mouth 3 (three) times daily as needed for muscle spasms.  Dispense: 30 tablet; Refill: 2   Subjective:    Outpatient Medications Prior to Visit  Medication Sig Dispense Refill   Ascorbic Acid (VITAMIN C) 100 MG tablet Take 100 mg by mouth daily.     Cholecalciferol (VITAMIN D3) 1.25 MG (50000 UT) CAPS Take by mouth.     ibandronate (BONIVA) 150 MG tablet Take 150 mg by mouth every 30 (thirty) days.     MAGNESIUM OXIDE PO Take by mouth.     Omeprazole Magnesium 10 MG PACK Take 10 mg by mouth daily.     sertraline (ZOLOFT) 50 MG tablet TAKE 1 TABLET BY MOUTH EVERY MORNING AND 2 TABLETS AT BEDTIME AS DIRECTED 90 tablet 1   cyclobenzaprine (FLEXERIL) 5 MG tablet Take 1-2 tablets (5-10 mg total) by mouth 3 (three) times daily as needed for muscle spasms. (Patient not taking: Reported on 05/17/2022) 30 tablet 1   meloxicam (MOBIC) 15 MG tablet Take 1 tablet (15 mg total) by mouth daily. (Patient not taking: Reported on 05/17/2022) 30 tablet 0   No facility-administered medications prior to visit.   Past Medical History:  Diagnosis Date   Abdominal pain in female patient 04/14/2012   Bowel obstruction (  [facility])    Osteoporosis    Past Surgical History:  Procedure Laterality Date   ABDOMINAL SURGERY  2005   repair of bowel obstruction   BREAST SURGERY  2004   breast implants   ESOPHAGOGASTRODUODENOSCOPY N/A 04/15/2012   Procedure: ESOPHAGOGASTRODUODENOSCOPY (EGD);  Surgeon: Petra Kuba, MD;  Location: [facility] ENDOSCOPY;  Service: Endoscopy;  Laterality: N/A;   No Known Allergies    Objective:    Physical Exam Vitals and nursing note reviewed.  Constitutional:      Appearance: Normal appearance.  Cardiovascular:     Rate and Rhythm: Normal rate and regular rhythm.  Pulmonary:     Effort: Pulmonary effort is normal.     Breath sounds: Normal  breath sounds.  Musculoskeletal:        General: Normal range of motion.  Skin:    General: Skin is warm and dry.  Neurological:     Mental Status: She is alert.  Psychiatric:        Mood and Affect: Mood normal.        Behavior: Behavior normal.    BP 124/70   Pulse 79   Temp (!) 97 F (36.1 C)   Ht 5\' 3"  (1.6 m)   Wt 126 lb 6.4 oz (57.3 kg)   SpO2 95%   BMI 22.39 kg/m  Wt Readings from Last 3 Encounters:  05/17/22 126 lb 6.4 oz (57.3 kg)  01/07/22 128 lb (58.1 kg)  11/30/21 127 lb 3.2 oz (57.7 kg)      Dulce Sellar, NP

## 2022-05-17 NOTE — Assessment & Plan Note (Signed)
chronic most trapezius muscle spams due to tension/stress reports Flexeril worked well, no SE continue to advise on stress reduction, use of heat tid prn, neck exercises/stretches throughout the day refilling Flexeril f/u 6 mos or prn

## 2022-05-24 ENCOUNTER — Encounter (HOSPITAL_COMMUNITY): Payer: Self-pay

## 2022-05-24 ENCOUNTER — Other Ambulatory Visit: Payer: Self-pay

## 2022-05-24 ENCOUNTER — Emergency Department (HOSPITAL_COMMUNITY)
Admission: EM | Admit: 2022-05-24 | Discharge: 2022-05-24 | Disposition: A | Discharge status: Home / Self Care | Payer: Self-pay | Attending: Emergency Medicine | Admitting: Emergency Medicine

## 2022-05-24 ENCOUNTER — Emergency Department (HOSPITAL_COMMUNITY): Payer: Self-pay

## 2022-05-24 DIAGNOSIS — R110 Nausea: Secondary | ICD-10-CM | POA: Insufficient documentation

## 2022-05-24 DIAGNOSIS — R1084 Generalized abdominal pain: Secondary | ICD-10-CM | POA: Insufficient documentation

## 2022-05-24 LAB — COMPREHENSIVE METABOLIC PANEL
ALT: 18 U/L (ref 0–44)
AST: 17 U/L (ref 15–41)
Albumin: 4.1 g/dL (ref 3.5–5.0)
Alkaline Phosphatase: 51 U/L (ref 38–126)
Anion gap: 8 (ref 5–15)
BUN: 15 mg/dL (ref 6–20)
CO2: 25 mmol/L (ref 22–32)
Calcium: 8.3 mg/dL — ABNORMAL LOW (ref 8.9–10.3)
Chloride: 104 mmol/L (ref 98–111)
Creatinine, Ser: 0.66 mg/dL (ref 0.44–1.00)
GFR, Estimated: >60 mL/min (ref >60)
Glucose, Bld: 89 mg/dL (ref 70–99)
Potassium: 3.9 mmol/L (ref 3.5–5.1)
Sodium: 137 mmol/L (ref 135–145)
Total Bilirubin: 0.2 mg/dL — ABNORMAL LOW (ref 0.3–1.2)
Total Protein: 6.9 g/dL (ref 6.5–8.1)

## 2022-05-24 LAB — CBC
HCT: 38.5 % (ref 36.0–46.0)
Hemoglobin: 12.6 g/dL (ref 12.0–15.0)
MCH: 28.4 pg (ref 26.0–34.0)
MCHC: 32.7 g/dL (ref 30.0–36.0)
MCV: 86.9 fL (ref 80.0–100.0)
Platelets: 362 10*3/uL (ref 150–400)
RBC: 4.43 MIL/uL (ref 3.87–5.11)
RDW: 12.4 % (ref 11.5–15.5)
WBC: 6.2 10*3/uL (ref 4.0–10.5)
nRBC: 0 % (ref 0.0–0.2)

## 2022-05-24 LAB — LIPASE, BLOOD: Lipase: 38 U/L (ref 11–51)

## 2022-05-24 LAB — URINALYSIS, ROUTINE W REFLEX MICROSCOPIC
Bilirubin Urine: NEGATIVE
Glucose, UA: NEGATIVE mg/dL
Hgb urine dipstick: NEGATIVE
Ketones, ur: NEGATIVE mg/dL
Leukocytes,Ua: NEGATIVE
Nitrite: NEGATIVE
Protein, ur: NEGATIVE mg/dL
Specific Gravity, Urine: 1.009 (ref 1.005–1.030)
pH: 5 (ref 5.0–8.0)

## 2022-05-24 MED ORDER — METOCLOPRAMIDE HCL 10 MG PO TABS: 10.0000 mg | ORAL_TABLET | Freq: Four times a day (QID) | ORAL | 0 refills | Status: DC

## 2022-05-24 MED ORDER — IOHEXOL 300 MG/ML  SOLN
100.0000 mL | Freq: Once | INTRAMUSCULAR | Status: AC | PRN
  Administered 2022-05-24: 100 mL via INTRAVENOUS

## 2022-05-24 NOTE — ED Triage Notes (Signed)
Pt c/o upper abdominal pain and nausea x 10 days- 2 weeks. Currently, denies pain.  Pt reports pain subsided w/ Pepto-Bismol.  Hx of ulcers.

## 2022-05-24 NOTE — ED Provider Notes (Signed)
[place] EMERGENCY DEPARTMENT AT [facility] Provider Note   CSN: [id number] Arrival date & time: 05/24/22  1541     History  Chief Complaint  Patient presents with   Abdominal Pain   Nausea    Erika Burns is a 57 y.o. female.  57 year old female with prior medical history as detailed below presents for evaluation.  Patient complains of persistent abdominal pain and nausea x 1 to 2 weeks.  Patient apparently contacted her gastroenterologist and was advised to come to the ED for evaluation.  On evaluation today in the emergency department the patient denies any pain.  She reports that she feels significantly better today after using Pepto-Bismol yesterday.  She denies fever.  She denies vomiting.  She denies change in bowel movements.    The history is provided by the patient and medical records.       Home Medications Prior to Admission medications   Medication Sig Start Date End Date Taking? Authorizing Provider  Ascorbic Acid (VITAMIN C) 100 MG tablet Take 100 mg by mouth daily.    [provider]  busPIRone (BUSPAR) 5 MG tablet Take 1-2 tablets (5-10 mg total) by mouth 2 (two) times daily. 05/17/22   Dulce Sellar, NP  Cholecalciferol (VITAMIN D3) 1.25 MG (50000 UT) CAPS Take by mouth.    [provider]  clonazePAM (KLONOPIN) 0.25 MG disintegrating tablet Take 1-2 tablets (0.25-0.5 mg total) by mouth daily as needed (Severe anxiety). 05/17/22   Dulce Sellar, NP  cyclobenzaprine (FLEXERIL) 5 MG tablet Take 1-2 tablets (5-10 mg total) by mouth 3 (three) times daily as needed for muscle spasms. 05/17/22   Dulce Sellar, NP  ibandronate (BONIVA) 150 MG tablet Take 150 mg by mouth every 30 (thirty) days. 11/01/21   [provider]  MAGNESIUM OXIDE PO Take by mouth.    [provider]  Omeprazole Magnesium 10 MG PACK Take 10 mg by mouth daily.    [provider]      Allergies    Patient has no known  allergies.    Review of Systems   Review of Systems  All other systems reviewed and are negative.   Physical Exam Updated Vital Signs BP 133/76 (BP Location: Left Arm)   Pulse 62   Temp 97.7 F (36.5 C) (Oral)   Resp 17   SpO2 100%  Physical Exam Vitals and nursing note reviewed.  Constitutional:      General: She is not in acute distress.    Appearance: Normal appearance. She is well-developed.  HENT:     Head: Normocephalic and atraumatic.  Eyes:     Conjunctiva/sclera: Conjunctivae normal.     Pupils: Pupils are equal, round, and reactive to light.  Cardiovascular:     Rate and Rhythm: Normal rate and regular rhythm.     Heart sounds: Normal heart sounds.  Pulmonary:     Effort: Pulmonary effort is normal. No respiratory distress.     Breath sounds: Normal breath sounds.  Abdominal:     General: There is no distension.     Palpations: Abdomen is soft.     Tenderness: There is no abdominal tenderness.  Musculoskeletal:        General: No deformity. Normal range of motion.     Cervical back: Normal range of motion and neck supple.  Skin:    General: Skin is warm and dry.  Neurological:     General: No focal deficit present.  Mental Status: She is alert and oriented to person, place, and time.     ED Results / Procedures / Treatments   Labs (all labs ordered are listed, but only abnormal results are displayed) Labs Reviewed  COMPREHENSIVE METABOLIC PANEL - Abnormal; Notable for the following components:      Result Value   Calcium 8.3 (*)    Total Bilirubin 0.2 (*)    All other components within normal limits  LIPASE, BLOOD  CBC  URINALYSIS, ROUTINE W REFLEX MICROSCOPIC    EKG None  Radiology No results found.  Procedures Procedures    Medications Ordered in ED Medications - No data to display  ED Course/ Medical Decision Making/ A&P                             Medical Decision Making Amount and/or Complexity of Data Reviewed Labs:  ordered. Radiology: ordered.  Risk Prescription drug management.    Medical Screen Complete  This patient presented to the ED with complaint of abdominal pain.  This complaint involves an extensive number of treatment options. The initial differential diagnosis includes, but is not limited to, intra-abdominal pathology, metabolic abnormality, etc.  This presentation is: Acute, Chronic, Self-Limited, Previously Undiagnosed, Uncertain Prognosis, Complicated, Systemic Symptoms, and Threat to Life/Bodily Function  Patient is complaining of persistent abdominal discomfort x 2 to 3 weeks.  Patient's symptoms have resolved today.  Obtained screening labs are without significant abnormality.  Obtained CT is without acute abnormality.  Patient is reporting full compliance with omeprazole as previously prescribed.  Patient with apparent distant history of gastroparesis.  Patient has been on Reglan in the past.  She is not currently on same.  Patient is agreeable with trial of Reglan (in addition to omeprazole).  She has already contacted her outpatient GI team for follow-up.  She is advised to closely follow-up with gastroenterology in the outpatient setting.  Importance of close follow-up is stressed.  Strict return precautions given and understood.  Additional history obtained:  External records from outside sources obtained and reviewed including prior ED visits and prior Inpatient records.    Lab Tests:  I ordered and personally interpreted labs.  The pertinent results include: CBC, CMP, lipase, UA   Imaging Studies ordered:  I ordered imaging studies including CT abdomen pelvis with I independently visualized and interpreted obtained imaging which showed NAD I agree with the radiologist interpretation.   Cardiac Monitoring:  The patient was maintained on a cardiac monitor.  I personally viewed and interpreted the cardiac monitor which showed an underlying rhythm of:  NSR   Problem List / ED Course:  Abdominal pain   Reevaluation:  After the interventions noted above, I reevaluated the patient and found that they have: resolved   Disposition:  After consideration of the diagnostic results and the patients response to treatment, I feel that the patent would benefit from close outpatient follow-up.          Final Clinical Impression(s) / ED Diagnoses Final diagnoses:  Generalized abdominal pain    Rx / DC Orders ED Discharge Orders          Ordered    metoCLOPramide (REGLAN) 10 MG tablet  Every 6 hours        05/24/22 2109              Wynetta Fines, MD 05/24/22 2135

## 2022-05-24 NOTE — ED Provider Triage Note (Signed)
Emergency Medicine Provider Triage Evaluation Note  Erika Burns , a 57 y.o. female  was evaluated in triage.  Pt complains of abdominal pain and nausea for the past couple weeks.  She states the pain subsided with Pepto-Bismol.  She has history for ulcers and is concerned she has another one.  She called her gastro office who told her to proceed to ED for evaluation.  Denies melena, hematochezia, diarrhea, vomiting.   Review of Systems  Positive: As above Negative: As above  Physical Exam  BP 133/76 (BP Location: Left Arm)   Pulse 62   Temp 97.7 F (36.5 C) (Oral)   Resp 17   SpO2 100%  Gen:   Awake, no distress   Resp:  Normal effort  MSK:   Moves extremities without difficulty  Other:    Medical Decision Making  Medically screening exam initiated at 4:31 PM.  Appropriate orders placed.  Erika Burns was informed that the remainder of the evaluation will be completed by another provider, this initial triage assessment does not replace that evaluation, and the importance of remaining in the ED until their evaluation is complete.     Erika Burns, [place] 05/24/22 [phone]

## 2022-05-24 NOTE — Discharge Instructions (Signed)
Return for any problem.   As discussed, follow-up with Gastroenterology tomorrow.

## 2022-05-26 ENCOUNTER — Telehealth: Payer: Self-pay

## 2022-05-26 NOTE — Transitions of Care (Post Inpatient/ED Visit) (Signed)
   05/26/2022  Name: Erika Burns MRN: [id number] DOB: September 11, 1965  Today's TOC FU Call Status: Today's TOC FU Call Status:: Unsuccessul Call (1st Attempt) Unsuccessful Call (1st Attempt) Date: 05/26/22  Attempted to reach the patient regarding the most recent Inpatient/ED visit.  Follow Up Plan: Additional outreach attempts will be made to reach the patient to complete the Transitions of Care (Post Inpatient/ED visit) call.   Signature Adela Glimpse, CMA

## 2022-05-27 ENCOUNTER — Telehealth: Payer: Self-pay

## 2022-05-27 NOTE — Transitions of Care (Post Inpatient/ED Visit) (Signed)
   05/27/2022  Name: Erika Burns MRN: [id number] DOB: 1965/11/29  Today's TOC FU Call Status:    Transition Care Management Follow-up Telephone Call Date of Discharge: 05/24/22 Discharge Facility: [facility]) Type of Discharge: Emergency Department Reason for ED Visit: Other: How have you been since you were released from the hospital?: Better Any questions or concerns?: No  Patient declined scheduling appt at this time. Aware to return call if she wants to schedule. Aware that PCP will be out of office the rest of the month, but that there are other providers able to see her.  Items Reviewed: Did you receive and understand the discharge instructions provided?: Yes Medications obtained,verified, and reconciled?: Yes (Medications Reviewed) Any new allergies since your discharge?: No Dietary orders reviewed?: NA Do you have support at home?: Yes  Medications Reviewed Today: Medications Reviewed Today     Reviewed by Gwenette Greet, CMA (Certified Medical Assistant) on 05/17/22 at 1004  Med List Status: <None>   Medication Order Taking? Sig Documenting Provider Last Dose Status Informant  Ascorbic Acid (VITAMIN C) 100 MG tablet [id number]  Take 100 mg by mouth daily. [provider]  Active   Cholecalciferol (VITAMIN D3) 1.25 MG (50000 UT) CAPS [id number]  Take by mouth. [provider]  Active   cyclobenzaprine (FLEXERIL) 5 MG tablet [id number]  Take 1-2 tablets (5-10 mg total) by mouth 3 (three) times daily as needed for muscle spasms. Dulce Sellar, NP  Active   ibandronate (BONIVA) 150 MG tablet [id number]  Take 150 mg by mouth every 30 (thirty) days. [provider]  Active   MAGNESIUM OXIDE PO [id number]  Take by mouth. [provider]  Active   meloxicam (MOBIC) 15 MG tablet [id number]  Take 1 tablet (15 mg total) by mouth daily. Richardean Sale, DO  Active   Omeprazole Magnesium 10 MG PACK [id number]  Take 10 mg by mouth daily. [provider]  Active   sertraline (ZOLOFT) 50 MG tablet [id number]  TAKE 1 TABLET BY MOUTH EVERY MORNING AND 2 TABLETS AT BEDTIME AS DIRECTED Hudnell, Stephanie, NP  Active             Home Care and Equipment/Supplies: Were Home Health Services Ordered?: NA Any new equipment or medical supplies ordered?: NA  Functional Questionnaire: Do you need assistance with bathing/showering or dressing?: No Do you need assistance with meal preparation?: No Do you need assistance with eating?: No Do you have difficulty maintaining continence: No Do you need assistance with getting out of bed/getting out of a chair/moving?: No Do you have difficulty managing or taking your medications?: No  Follow up appointments reviewed: PCP Follow-up appointment confirmed?: NA Specialist Hospital Follow-up appointment confirmed?: NA Do you need transportation to your follow-up appointment?: No Do you understand care options if your condition(s) worsen?: Yes-patient verbalized understanding    SIGNATURE Adela Glimpse, CMA

## 2022-05-29 ENCOUNTER — Other Ambulatory Visit: Payer: Self-pay | Admitting: Family

## 2022-05-29 DIAGNOSIS — F411 Generalized anxiety disorder: Secondary | ICD-10-CM

## 2022-06-22 ENCOUNTER — Ambulatory Visit: Payer: Self-pay | Admitting: Family

## 2022-06-23 ENCOUNTER — Ambulatory Visit (INDEPENDENT_AMBULATORY_CARE_PROVIDER_SITE_OTHER): Payer: Self-pay | Admitting: Family

## 2022-06-23 VITALS — BP 108/71 | HR 77 | Temp 97.8°F | Ht 63.0 in | Wt 124.4 lb

## 2022-06-23 DIAGNOSIS — F411 Generalized anxiety disorder: Secondary | ICD-10-CM

## 2022-06-23 DIAGNOSIS — M810 Age-related osteoporosis without current pathological fracture: Secondary | ICD-10-CM

## 2022-06-23 DIAGNOSIS — K3184 Gastroparesis: Secondary | ICD-10-CM

## 2022-06-23 DIAGNOSIS — M1611 Unilateral primary osteoarthritis, right hip: Secondary | ICD-10-CM

## 2022-06-23 MED ORDER — SERTRALINE HCL 50 MG PO TABS
50.0000 mg | ORAL_TABLET | Freq: Two times a day (BID) | ORAL | 1 refills | Status: DC
Start: 2022-06-23 — End: 2022-10-07

## 2022-06-23 MED ORDER — IBANDRONATE SODIUM 150 MG PO TABS
150.0000 mg | ORAL_TABLET | ORAL | 0 refills | Status: DC
Start: 2022-06-23 — End: 2023-08-04

## 2022-06-23 MED ORDER — METOCLOPRAMIDE HCL 10 MG PO TABS
10.0000 mg | ORAL_TABLET | Freq: Every day | ORAL | 3 refills | Status: AC
Start: 2022-06-23 — End: ?

## 2022-06-23 MED ORDER — BUSPIRONE HCL 7.5 MG PO TABS
7.5000 mg | ORAL_TABLET | Freq: Two times a day (BID) | ORAL | 3 refills | Status: DC
Start: 2022-06-23 — End: 2022-10-07

## 2022-06-23 NOTE — Progress Notes (Signed)
Patient ID: Erika Burns, female    DOB: [birth year], 57 y.o.   MRN: [id number]  Chief Complaint  Patient presents with   Anxiety    Pt states her Anxiety has been a little better.   Hip Pain    Pt c/o osteoporosis in right right hip and osteopenia in left hip. Would like to discuss starting back on Boniva.    HPI: Right hip pain:   Patient reports intermittent pain for a year, when she abducts she has sharp pain, states she has osteoporosis of the right hip takes boniva qmonth has been on 4 months now, at night she gets radiating pain down her leg ,no pain in flexion, has taken Ibuprofen for the pain and that seems to help a little, however not rec d/t gastroparesis, no numbness or tingling, has flares that will last for a little bit of time. Seen by sports med, xrays indicate arthritis, pt asking about getting injections. Reports walking on uneven surfaces will aggravate pain, but does ok on elliptical and thinks this helps her pain.   Anxiety:   pt reports taking Zoloft up until last year, up to 150mg , but made her very sleepy &  switched to Celexa and it made her feel worse, more tense. 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 Recently restarted 50mg  tabs she had left over. *Update - pt reports increasing zoloft but was not working for her so she stopped taking, has since had more stress r/t her dtr having postpartum depression; she has taken Lexapro in past that also didn't work w/old PCP.  Abd pain:  pt seen in ER a month ago due to pain and nausea and restarted on Reglan in addition to her Prilosec qd, pt thinks episode brought on by the increased stress she has had at home. Reports GI dx her w/gastroparesis in past and has had Endoscopy.   Assessment & Plan:  Generalized anxiety disorder Assessment & Plan: chronic pt doing better on Buspar 5mg  bid, states she restarted the Zoloft and takes 50mg  bid with the Buspar which seems to be working. sending 7.5mg   Buspar bid, refilling Zoloft f/u 3 mos  Orders: -     busPIRone HCl; Take 1 tablet (7.5 mg total) by mouth 2 (two) times daily.  Dispense: 60 tablet; Refill: 3 -     Sertraline HCl; Take 1 tablet (50 mg total) by mouth in the morning and at bedtime.  Dispense: 180 tablet; Refill: 1  Gastroparesis Assessment & Plan: Dx in past by GI seen in ER recently for flare restarted on Reglan, pt reports it causes drowsiness and she just takes qhs which seems to be helping taking Prilosec qd refilling Reglan, pt advised to f/u w/GI f/u 7m-45yr  Orders: -     Metoclopramide HCl; Take 1 tablet (10 mg total) by mouth at bedtime.  Dispense: 30 tablet; Refill: 3  Age-related osteoporosis without current pathological fracture Assessment & Plan: chronic pt started on Boniva end of last year - 2023 needs refill today, advised on precautions w/taking med f/u 1 yr  Orders: -     Ibandronate Sodium; Take 1 tablet (150 mg total) by mouth every 30 (thirty) days.  Dispense: 12 tablet; Refill: 0  Osteoarthritis of right hip, unspecified osteoarthritis type Assessment & Plan: chronic  seen by sports med 6 mos ago, given mobic x 2w, pt unsure if helped or not, also PT recommended pt asking about joint injections,  advised on pros/cons & can discuss with sports med provider  can try Turmeric w/black curcumin OTC supplement, advised on dose & SE f/u prn    Subjective:    Outpatient Medications Prior to Visit  Medication Sig Dispense Refill   Ascorbic Acid (VITAMIN C) 100 MG tablet Take 100 mg by mouth daily.     Cholecalciferol (VITAMIN D3) 1.25 MG (50000 UT) CAPS Take by mouth.     clonazePAM (KLONOPIN) 0.25 MG disintegrating tablet Take 1-2 tablets (0.25-0.5 mg total) by mouth daily as needed (Severe anxiety). 20 tablet 0   cyclobenzaprine (FLEXERIL) 5 MG tablet Take 1-2 tablets (5-10 mg total) by mouth 3 (three) times daily as needed for muscle spasms. 30 tablet 2   MAGNESIUM OXIDE PO Take by mouth.      Omeprazole Magnesium 10 MG PACK Take 10 mg by mouth daily.     busPIRone (BUSPAR) 5 MG tablet Take 1-2 tablets (5-10 mg total) by mouth 2 (two) times daily. 60 tablet 0   ibandronate (BONIVA) 150 MG tablet Take 150 mg by mouth every 30 (thirty) days.     metoCLOPramide (REGLAN) 10 MG tablet Take 1 tablet (10 mg total) by mouth every 6 (six) hours. 30 tablet 0   No facility-administered medications prior to visit.   Past Medical History:  Diagnosis Date   Abdominal pain in female patient 04/14/2012   Bowel obstruction [facility])    Osteoporosis    Past Surgical History:  Procedure Laterality Date   ABDOMINAL SURGERY  2005   repair of bowel obstruction   BREAST SURGERY  2004   breast implants   ESOPHAGOGASTRODUODENOSCOPY N/A 04/15/2012   Procedure: ESOPHAGOGASTRODUODENOSCOPY (EGD);  Surgeon: Petra Kuba, MD;  Location: [facility] ENDOSCOPY;  Service: Endoscopy;  Laterality: N/A;   No Known Allergies    Objective:    Physical Exam Vitals and nursing note reviewed.  Constitutional:      Appearance: Normal appearance.  Cardiovascular:     Rate and Rhythm: Normal rate and regular rhythm.  Pulmonary:     Effort: Pulmonary effort is normal.     Breath sounds: Normal breath sounds.  Musculoskeletal:        General: Normal range of motion.  Skin:    General: Skin is warm and dry.  Neurological:     Mental Status: She is alert.  Psychiatric:        Mood and Affect: Mood normal.        Behavior: Behavior normal.    BP 108/71   Pulse 77   Temp 97.8 F (36.6 C) (Temporal)   Ht 5\' 3"  (1.6 m)   Wt 124 lb 6.4 oz (56.4 kg)   SpO2 95%   BMI 22.04 kg/m  Wt Readings from Last 3 Encounters:  06/23/22 124 lb 6.4 oz (56.4 kg)  05/17/22 126 lb 6.4 oz (57.3 kg)  01/07/22 128 lb (58.1 kg)      Dulce Sellar, NP

## 2022-06-23 NOTE — Assessment & Plan Note (Addendum)
chronic  seen by sports med 6 mos ago, given mobic x 2w, pt unsure if helped or not, also PT recommended pt asking about joint injections, advised on pros/cons & can discuss with sports med provider  can try Turmeric w/black curcumin OTC supplement, advised on dose & SE f/u prn

## 2022-06-23 NOTE — Assessment & Plan Note (Signed)
Dx in past by GI seen in ER recently for flare restarted on Reglan, pt reports it causes drowsiness and she just takes qhs which seems to be helping taking Prilosec qd refilling Reglan, pt advised to f/u w/GI f/u 64m-70yr

## 2022-06-23 NOTE — Assessment & Plan Note (Addendum)
chronic pt started on Boniva end of last year - 2023 needs refill today, advised on precautions w/taking med f/u 1 yr

## 2022-06-23 NOTE — Assessment & Plan Note (Signed)
chronic pt doing better on Buspar 5mg  bid, states she restarted the Zoloft and takes 50mg  bid with the Buspar which seems to be working. sending 7.5mg  Buspar bid, refilling Zoloft f/u 3 mos

## 2022-09-02 ENCOUNTER — Encounter (INDEPENDENT_AMBULATORY_CARE_PROVIDER_SITE_OTHER): Payer: Self-pay

## 2022-09-23 ENCOUNTER — Ambulatory Visit: Payer: Self-pay | Admitting: Family

## 2022-10-07 ENCOUNTER — Ambulatory Visit (INDEPENDENT_AMBULATORY_CARE_PROVIDER_SITE_OTHER): Payer: Self-pay | Admitting: Family

## 2022-10-07 VITALS — BP 129/81 | HR 60 | Temp 98.0°F | Ht 63.0 in

## 2022-10-07 DIAGNOSIS — F411 Generalized anxiety disorder: Secondary | ICD-10-CM

## 2022-10-07 DIAGNOSIS — M1611 Unilateral primary osteoarthritis, right hip: Secondary | ICD-10-CM

## 2022-10-07 MED ORDER — SERTRALINE HCL 50 MG PO TABS
50.0000 mg | ORAL_TABLET | Freq: Two times a day (BID) | ORAL | 3 refills | Status: DC
Start: 2022-10-07 — End: 2023-10-13

## 2022-10-07 MED ORDER — BUSPIRONE HCL 7.5 MG PO TABS
7.5000 mg | ORAL_TABLET | Freq: Two times a day (BID) | ORAL | 3 refills | Status: AC
Start: 2022-10-07 — End: ?

## 2022-10-07 NOTE — Progress Notes (Signed)
Patient ID: Erika Burns, female    DOB: 10-05-1965, 57 y.o.   MRN: [id number]  Chief Complaint  Patient presents with   Anxiety    HPI: Right hip pain:   Patient reports intermittent pain for a year, when she abducts she has sharp pain, states she has osteoporosis of the right hip takes boniva qmonth has been on over 7 months now, at night she gets radiating pain down her leg, no pain in flexion, has taken Ibuprofen for the pain and that seems to help a little, however not rec d/t gastroparesis, no numbness or tingling, has flares that will last for a little bit of time. Seen by sports med, xrays indicate arthritis, pt wants to hold off on steroid injections for now. Reports walking on uneven surfaces or treadmill will aggravate pain, but does ok on elliptical and thinks this helps her pain. Given Flexeril last visit which she reports has helped also, mainly takes in evenings.  Anxiety:   pt reports taking Zoloft up until last year, up to 150mg , but made her very sleepy &  switched to Celexa and it made her feel worse, more tense. 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 Recently restarted 50mg  tabs she had left over. *Update - pt reports increasing zoloft but was not working for her so she stopped taking, has since had more stress r/t her dtr having postpartum depression; she has taken Lexapro in past that also didn't work w/old PCP. Restarted on Zoloft, taking 50mg  bid, and Buspirone 7.5mg  bid, doing better.  Assessment & Plan:  Generalized anxiety disorder Assessment & Plan: chronic on Buspar 7.5mg  bid with Zoloft 50mg  bid  sending refills f/u 1 yr - pt requesting d/t self pay  Orders: -     Sertraline HCl; Take 1 tablet (50 mg total) by mouth in the morning and at bedtime.  Dispense: 180 tablet; Refill: 3 -     busPIRone HCl; Take 1 tablet (7.5 mg total) by mouth 2 (two) times daily.  Dispense: 180 tablet; Refill: 3  Primary osteoarthritis of right  hip Assessment & Plan: chronic seen by sports med, imaging done, given mobic x 2w, pt unsure if helped or not, also PT recommended but pt has not started Flexeril 5-10mg  given last visit, takes at night, helps some reports her mother sees a therapist for dry needling & is considering this advised on benefits of PT & they offer multiple modalities for treatment f/u prn    Subjective:    Outpatient Medications Prior to Visit  Medication Sig Dispense Refill   Ascorbic Acid (VITAMIN C) 100 MG tablet Take 100 mg by mouth daily.     Cholecalciferol (VITAMIN D3) 1.25 MG (50000 UT) CAPS Take by mouth.     clonazePAM (KLONOPIN) 0.25 MG disintegrating tablet Take 1-2 tablets (0.25-0.5 mg total) by mouth daily as needed (Severe anxiety). 20 tablet 0   cyclobenzaprine (FLEXERIL) 5 MG tablet Take 1-2 tablets (5-10 mg total) by mouth 3 (three) times daily as needed for muscle spasms. 30 tablet 2   ibandronate (BONIVA) 150 MG tablet Take 1 tablet (150 mg total) by mouth every 30 (thirty) days. 12 tablet 0   MAGNESIUM OXIDE PO Take by mouth.     metoCLOPramide (REGLAN) 10 MG tablet Take 1 tablet (10 mg total) by mouth at bedtime. 30 tablet 3   Omeprazole Magnesium 10 MG PACK Take 10 mg by mouth daily.  busPIRone (BUSPAR) 7.5 MG tablet Take 1 tablet (7.5 mg total) by mouth 2 (two) times daily. 60 tablet 3   sertraline (ZOLOFT) 50 MG tablet Take 1 tablet (50 mg total) by mouth in the morning and at bedtime. 180 tablet 1   No facility-administered medications prior to visit.   Past Medical History:  Diagnosis Date   Abdominal pain in female patient 04/14/2012   Bowel obstruction [facility])    Osteoporosis    Past Surgical History:  Procedure Laterality Date   ABDOMINAL SURGERY  2005   repair of bowel obstruction   BREAST SURGERY  2004   breast implants   ESOPHAGOGASTRODUODENOSCOPY N/A 04/15/2012   Procedure: ESOPHAGOGASTRODUODENOSCOPY (EGD);  Surgeon: Petra Kuba, MD;  Location: [facility] ENDOSCOPY;   Service: Endoscopy;  Laterality: N/A;   No Known Allergies    Objective:    Physical Exam Vitals and nursing note reviewed.  Constitutional:      Appearance: Normal appearance.  Cardiovascular:     Rate and Rhythm: Normal rate and regular rhythm.  Pulmonary:     Effort: Pulmonary effort is normal.     Breath sounds: Normal breath sounds.  Musculoskeletal:     Right hip: Tenderness present. Decreased range of motion.  Skin:    General: Skin is warm and dry.  Neurological:     Mental Status: She is alert.  Psychiatric:        Mood and Affect: Mood normal.        Behavior: Behavior normal.    BP 129/81 (BP Location: Left Arm, Patient Position: Sitting, Cuff Size: Small)   Pulse 60   Temp 98 F (36.7 C) (Temporal)   Ht 5\' 3"  (1.6 m)   SpO2 99%   BMI 22.04 kg/m  Wt Readings from Last 3 Encounters:  06/23/22 124 lb 6.4 oz (56.4 kg)  05/17/22 126 lb 6.4 oz (57.3 kg)  01/07/22 128 lb (58.1 kg)       Dulce Sellar, NP

## 2022-10-07 NOTE — Patient Instructions (Addendum)
It was very nice to see you today!   I have sent your Zoloft and Buspar refills.  Consider physical therapy for your hip pain, we have PT here in this office as well as other locations in town, or can try with your mother's recommended clinic.  Try 1-2 generic Aleve (Naproxen) one or 2 times per day for pain, alternating with the Tylenol arthritis. This is easier on your stomach and kidneys than Ibuprofen.  Continue to exercise, biking or elliptical machines are less impact on the joints.  Schedule a one year follow up or sooner if needed.    PLEASE NOTE:  If you had any lab tests please let us know if you have not heard back within a few days. You may see your results on MyChart before we have a chance to review them but we will give you a call once they are reviewed by [place]. If we ordered any referrals today, please let us know if you have not heard from their office within the next week.

## 2022-10-07 NOTE — Assessment & Plan Note (Signed)
chronic on Buspar 7.5mg  bid with Zoloft 50mg  bid  sending refills f/u 1 yr - pt requesting d/t self pay

## 2022-10-07 NOTE — Assessment & Plan Note (Addendum)
chronic seen by sports med, imaging done, given mobic x 2w, pt unsure if helped or not, also PT recommended but pt has not started Flexeril 5-10mg  given last visit, takes at night, helps some reports her mother sees a therapist for dry needling & is considering this advised on benefits of PT & they offer multiple modalities for treatment f/u prn

## 2022-10-13 ENCOUNTER — Other Ambulatory Visit: Payer: Self-pay | Admitting: Sports Medicine

## 2022-10-13 ENCOUNTER — Telehealth: Payer: Self-pay | Admitting: Sports Medicine

## 2022-10-13 DIAGNOSIS — M25551 Pain in right hip: Secondary | ICD-10-CM

## 2022-10-13 NOTE — Progress Notes (Signed)
[facility] physical therapy for right hip pain.

## 2022-10-13 NOTE — Telephone Encounter (Signed)
[facility] patient.

## 2022-10-13 NOTE — Telephone Encounter (Signed)
We referred patient for PT in January. Patient did not schedule as she was trying to do HEP. Patient would now like [place] to resend this referral to [facility] as she is ready to try Physical Therapy. No changes per pt, just need a current referral if possible.

## 2022-10-13 NOTE — Telephone Encounter (Signed)
Referral placed.

## 2022-10-14 ENCOUNTER — Ambulatory Visit: Payer: Self-pay | Attending: Sports Medicine

## 2022-10-14 ENCOUNTER — Other Ambulatory Visit: Payer: Self-pay

## 2022-10-14 DIAGNOSIS — M6281 Muscle weakness (generalized): Secondary | ICD-10-CM | POA: Insufficient documentation

## 2022-10-14 DIAGNOSIS — R252 Cramp and spasm: Secondary | ICD-10-CM | POA: Insufficient documentation

## 2022-10-14 DIAGNOSIS — R262 Difficulty in walking, not elsewhere classified: Secondary | ICD-10-CM | POA: Insufficient documentation

## 2022-10-14 DIAGNOSIS — M25551 Pain in right hip: Secondary | ICD-10-CM | POA: Insufficient documentation

## 2022-10-14 NOTE — Therapy (Signed)
OUTPATIENT PHYSICAL THERAPY LOWER EXTREMITY EVALUATION   Patient Name: Erika Burns MRN: [id number] DOB:1965-07-21, 57 y.o., female Today's Date: 10/14/2022  END OF SESSION:  PT End of Session - 10/14/22 0853     Visit Number 1    Date for PT Re-Evaluation 12/09/22    Authorization Type self pay    Progress Note Due on Visit 10    PT Start Time 0845    PT Stop Time 0930    PT Time Calculation (min) 45 min    Activity Tolerance Patient tolerated treatment well    Behavior During Therapy [facility] for tasks assessed/performed             Past Medical History:  Diagnosis Date   Abdominal pain in female patient 04/14/2012   Bowel obstruction [facility])    Osteoporosis    Past Surgical History:  Procedure Laterality Date   ABDOMINAL SURGERY  2005   repair of bowel obstruction   BREAST SURGERY  2004   breast implants   ESOPHAGOGASTRODUODENOSCOPY N/A 04/15/2012   Procedure: ESOPHAGOGASTRODUODENOSCOPY (EGD);  Surgeon: Petra Kuba, MD;  Location: [facility] ENDOSCOPY;  Service: Endoscopy;  Laterality: N/A;   Patient Active Problem List   Diagnosis Date Noted   Gastroparesis 06/23/2022   Osteoarthritis of right hip 06/23/2022   Cervicalgia 05/17/2022   Age-related osteoporosis without current pathological fracture 11/30/2021   Generalized anxiety disorder 11/17/2021    PCP: Dulce Sellar, NP  REFERRING PROVIDER: Richardean Sale, DO  REFERRING DIAG:  Diagnosis  M25.551 (ICD-10-CM) - Right hip pain    THERAPY DIAG:  Pain in right hip  Difficulty in walking, not elsewhere classified  Muscle weakness (generalized)  Cramp and spasm  Rationale for Evaluation and Treatment: Rehabilitation  ONSET DATE: 10/13/2022  SUBJECTIVE:   SUBJECTIVE STATEMENT: Patient states she has been having right hip pain since January 2024.  She recalls a fall over 2 years ago that may have contributed.  Takes supplement for osteoporosis and osteopenia.  Hurts both with prolonged sitting and  start up and with prolonged activity on her feet.  Not working formal job but watches her grandson 3 days per week.  He is just over a year old and she has to pick him up often.  She enjoyed walking prior to her hip pain.  Sleeps through the night most of the time.  Still does her housework but must take more frequent rest breaks.  PERTINENT HISTORY: Larey Seat 2 years ago down steps on her bottom.  PAIN:  Are you having pain? Yes: NPRS scale: 2/10 Pain location: right hip Pain description: aching Aggravating factors: standing, post rest Relieving factors: ice, heat  PRECAUTIONS: None  RED FLAGS: None   WEIGHT BEARING RESTRICTIONS: No  FALLS:  Has patient fallen in last 6 months? 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   PLOF: Independent, Independent with basic ADLs, Independent with household mobility without device, Independent with community mobility without device, Independent with homemaking with ambulation, Independent with gait, and Independent with transfers  PATIENT GOALS: To eliminate hip pain and avoid injections or more medication  NEXT MD VISIT: prn  OBJECTIVE:   DIAGNOSTIC FINDINGS:  xray: No acute fracture or dislocation. Moderate-advanced degenerative arthritis right hip.  PATIENT SURVEYS:  FOTO 60, predicted 28  COGNITION: Overall cognitive status: Within functional limits for tasks assessed     SENSATION: [facility]  MUSCLE LENGTH: Hamstrings: Right 50 deg; Left 55 deg Thomas test: Right pos; Left pos  POSTURE: weight shift left   LOWER EXTREMITY ROM:  WFL  LOWER EXTREMITY MMT:  MMT Right eval Left eval  Hip flexion 4- 4  Hip extension 3+ 4  Hip abduction 4- 4  Hip adduction 5 5  Hip internal rotation 4 4  Hip external rotation 4 4  Knee flexion 5 5  Knee extension 5 5   (Blank rows = not tested)  LOWER EXTREMITY  SPECIAL TESTS:  Hip special tests: Luisa Hart (FABER) test: negative, Thomas test: positive , and Ober's test: positive   FUNCTIONAL TESTS:  5 times sit to stand: 8.20 sec Timed up and go (TUG): 7.25 sec  GAIT: Distance walked: 30 Assistive device utilized: None Level of assistance: Complete Independence Comments: slightly antalgic with mild trendelenburg   TODAY'S TREATMENT:                                                                                                                              DATE: 10/14/22  Initial eval completed and initiated HEP   PATIENT EDUCATION:  Education details: initiated HEP Person educated: Patient Education method: Programmer, multimedia, Facilities manager, Verbal cues, and Handouts Education comprehension: verbalized understanding, returned demonstration, and verbal cues required  HOME EXERCISE PROGRAM: Access Code: [email or web address] URL: [email or web address] Date: 10/14/2022 Prepared by: Mikey Kirschner  Exercises - Standing Hamstring Stretch on Chair  - 1 x daily - 7 x weekly - 1 sets - 3 reps - 30 sec hold - Standing Quad Stretch with Table and Chair Support  - 1 x daily - 7 x weekly - 1 sets - 3 reps - 30 sec hold - Seated Piriformis Stretch with Trunk Bend  - 1 x daily - 7 x weekly - 1 sets - 3 reps - 30 sec hold - Seated Hip External Rotation Stretch  - 1 x daily - 7 x weekly - 1 sets - 3 reps - 30 sec hold  ASSESSMENT:  CLINICAL IMPRESSION: Patient is a 58 y.o. female who was seen today for physical therapy evaluation and treatment for right hip pain.  She actually locates the pain at the anterior hip, in the area of the hip flexor and experiences discomfort with resisted hip flexion.  She also has pain at the pelvic rim and occasionally the buttock area.  She has weakness and poor flexibility throughout the right hip vs left.  She would benefit from skilled PT for bilateral hip flexibility and strengthening exercises for symmetrical pelvis.  We  may use dry needling if indicated for glut tightness.   OBJECTIVE IMPAIRMENTS: Abnormal gait, decreased mobility, difficulty walking, decreased ROM, decreased strength, hypomobility, increased fascial restrictions, increased muscle spasms, impaired flexibility, postural dysfunction, and pain.   ACTIVITY LIMITATIONS: carrying, lifting, bending, sitting, standing, squatting, transfers, bed mobility, dressing, and hygiene/grooming  PARTICIPATION LIMITATIONS: meal prep, cleaning, laundry, driving, shopping, community activity, occupation, yard work, and church  PERSONAL FACTORS: Fitness, Past/current experiences, Time since onset of injury/illness/exacerbation, and 1-2 comorbidities: osteoporosis, anxiety  are also affecting patient's functional outcome.   REHAB POTENTIAL: Good  CLINICAL DECISION MAKING: Stable/uncomplicated  EVALUATION COMPLEXITY: Low   GOALS: Goals reviewed with patient? Yes  SHORT TERM GOALS: Target date: 11/11/2022  Patient will be independent with initial HEP  Baseline: Goal status: INITIAL  2.  Pain report to be no greater than 4/10  Baseline:  Goal status: INITIAL   LONG TERM GOALS: Target date: 12/09/2022   Patient to report pain no greater than 2/10  Baseline:  Goal status: INITIAL  2.  Patient to be independent with advanced HEP  Baseline:  Goal status: INITIAL  3.  Patient to be able to bend, stoop and squat to pick up her grandson with good stability and no pain Baseline:  Goal status: INITIAL  4.  FOTO score to be 72 or better  Baseline:  Goal status: INITIAL  5.  Patient to report 85% improvement in overall symptoms Baseline:  Goal status: INITIAL   PLAN:  PT FREQUENCY: 1x/week  PT DURATION: 8 weeks  PLANNED INTERVENTIONS: Therapeutic exercises, Therapeutic activity, Neuromuscular re-education, Balance training, Gait training, Patient/Family education, Self Care, Joint mobilization, Stair training, DME instructions, Aquatic  Therapy, Dry Needling, Electrical stimulation, Spinal mobilization, Cryotherapy, Moist heat, Splintting, Taping, Vasopneumatic device, Traction, Ultrasound, Ionotophoresis 4mg /ml Dexamethasone, Manual therapy, and Re-evaluation  PLAN FOR NEXT SESSION: Nustep, review HEP   Vernell Barrier, PT 10/14/2022, 10:21 AM

## 2022-10-22 ENCOUNTER — Ambulatory Visit: Payer: Self-pay | Admitting: Physical Therapy

## 2022-10-22 ENCOUNTER — Other Ambulatory Visit: Payer: Self-pay | Admitting: Family

## 2022-10-22 DIAGNOSIS — Z1211 Encounter for screening for malignant neoplasm of colon: Secondary | ICD-10-CM

## 2022-10-22 DIAGNOSIS — Z1212 Encounter for screening for malignant neoplasm of rectum: Secondary | ICD-10-CM

## 2022-11-02 NOTE — Telephone Encounter (Addendum)
Patient called asking for PT information to be sent [facility] - [email or web address]  Information sent.

## 2022-11-04 ENCOUNTER — Encounter: Payer: Self-pay | Admitting: Physical Therapy

## 2022-12-22 NOTE — Progress Notes (Unsigned)
    Erika Burns D.[facility] [address] [place] [address] Phone: [phone]   Assessment and Plan:     There are no diagnoses linked to this encounter.  ***   Pertinent previous records reviewed include ***    Follow Up: ***     Subjective:   I, Erika Burns, am serving as a Neurosurgeon for Doctor Richardean Sale   Chief Complaint: hip pain    HPI:    01/07/2022 Patient is a 57 year old female complaining of hip pain. Patient states intermittent pain for a year , right hip pain , when she abducts she has sharp pain, states she has osteoporosis of the right hip takes boniva 1 month has been on 4 months now , at night she gets radiating pain down her leg ,no pain in flexion , ib for the pain and that seems to helps a little , no numbness or tingling, has flares that will last for a little bit of time    12/23/2022 Patient states   Relevant Historical Information: Osteoporosis on Boniva  Additional pertinent review of systems negative.   Current Outpatient Medications:    Ascorbic Acid (VITAMIN C) 100 MG tablet, Take 100 mg by mouth daily., Disp: , Rfl:    busPIRone (BUSPAR) 7.5 MG tablet, Take 1 tablet (7.5 mg total) by mouth 2 (two) times daily., Disp: 180 tablet, Rfl: 3   Cholecalciferol (VITAMIN D3) 1.25 MG (50000 UT) CAPS, Take by mouth., Disp: , Rfl:    clonazePAM (KLONOPIN) 0.25 MG disintegrating tablet, Take 1-2 tablets (0.25-0.5 mg total) by mouth daily as needed (Severe anxiety)., Disp: 20 tablet, Rfl: 0   cyclobenzaprine (FLEXERIL) 5 MG tablet, Take 1-2 tablets (5-10 mg total) by mouth 3 (three) times daily as needed for muscle spasms., Disp: 30 tablet, Rfl: 2   ibandronate (BONIVA) 150 MG tablet, Take 1 tablet (150 mg total) by mouth every 30 (thirty) days., Disp: 12 tablet, Rfl: 0   MAGNESIUM OXIDE PO, Take by mouth., Disp: , Rfl:    metoCLOPramide (REGLAN) 10 MG tablet, Take 1 tablet (10 mg total) by mouth at bedtime.,  Disp: 30 tablet, Rfl: 3   Omeprazole Magnesium 10 MG PACK, Take 10 mg by mouth daily., Disp: , Rfl:    sertraline (ZOLOFT) 50 MG tablet, Take 1 tablet (50 mg total) by mouth in the morning and at bedtime., Disp: 180 tablet, Rfl: 3   Objective:     There were no vitals filed for this visit.    There is no height or weight on file to calculate BMI.    Physical Exam:    ***   Electronically signed by:  Erika Burns D.[facility] 7:31 AM 12/22/22

## 2022-12-23 ENCOUNTER — Ambulatory Visit (INDEPENDENT_AMBULATORY_CARE_PROVIDER_SITE_OTHER): Payer: Self-pay | Admitting: Sports Medicine

## 2022-12-23 ENCOUNTER — Other Ambulatory Visit: Payer: Self-pay

## 2022-12-23 VITALS — HR 67 | Ht 63.0 in | Wt 123.0 lb

## 2022-12-23 DIAGNOSIS — M1611 Unilateral primary osteoarthritis, right hip: Secondary | ICD-10-CM

## 2022-12-23 DIAGNOSIS — M810 Age-related osteoporosis without current pathological fracture: Secondary | ICD-10-CM

## 2022-12-23 DIAGNOSIS — M25551 Pain in right hip: Secondary | ICD-10-CM

## 2022-12-23 MED ORDER — MELOXICAM 15 MG PO TABS: 15.0000 mg | ORAL_TABLET | Freq: Every day | ORAL | 0 refills | Status: DC

## 2022-12-23 NOTE — Patient Instructions (Addendum)
-   Start meloxicam 15 mg daily x2 weeks.  If still having pain after 2 weeks, complete 3rd-week of NSAID. May use remaining NSAID as needed once daily for pain control.  Do not to use additional over-the-counter NSAIDs (ibuprofen, naproxen, Advil, Aleve) while taking prescription NSAIDs.  May use Tylenol [phone] mg 2 to 3 times a day for breakthrough pain. 4 week follow up

## 2023-01-10 NOTE — Progress Notes (Deleted)
    Erika Burns D.[facility] [address] [place] [address] Phone: [phone]   Assessment and Plan:     There are no diagnoses linked to this encounter.  ***   Pertinent previous records reviewed include ***    Follow Up: ***     Subjective:   I, Erika Burns, am serving as a Neurosurgeon for Doctor Richardean Sale   Chief Complaint: hip pain    HPI:    01/07/2022 Patient is a 57 year old female complaining of hip pain. Patient states intermittent pain for a year , right hip pain , when she abducts she has sharp pain, states she has osteoporosis of the right hip takes boniva 1 month has been on 4 months now , at night she gets radiating pain down her leg ,no pain in flexion , ib for the pain and that seems to helps a little , no numbness or tingling, has flares that will last for a little bit of time     12/23/2022 Patient states that she is still in pain since the last time we saw her at that time the pain was intermittent now the pain is constant she has decrease ROM and pain with ADLs   01/20/2023 Patient states   Relevant Historical Information: Osteoporosis on Boniva  Additional pertinent review of systems negative.   Current Outpatient Medications:    Ascorbic Acid (VITAMIN C) 100 MG tablet, Take 100 mg by mouth daily., Disp: , Rfl:    busPIRone (BUSPAR) 7.5 MG tablet, Take 1 tablet (7.5 mg total) by mouth 2 (two) times daily., Disp: 180 tablet, Rfl: 3   Cholecalciferol (VITAMIN D3) 1.25 MG (50000 UT) CAPS, Take by mouth., Disp: , Rfl:    clonazePAM (KLONOPIN) 0.25 MG disintegrating tablet, Take 1-2 tablets (0.25-0.5 mg total) by mouth daily as needed (Severe anxiety)., Disp: 20 tablet, Rfl: 0   cyclobenzaprine (FLEXERIL) 5 MG tablet, Take 1-2 tablets (5-10 mg total) by mouth 3 (three) times daily as needed for muscle spasms., Disp: 30 tablet, Rfl: 2   ibandronate (BONIVA) 150 MG tablet, Take 1 tablet (150 mg total) by mouth  every 30 (thirty) days., Disp: 12 tablet, Rfl: 0   MAGNESIUM OXIDE PO, Take by mouth., Disp: , Rfl:    meloxicam (MOBIC) 15 MG tablet, Take 1 tablet (15 mg total) by mouth daily., Disp: 30 tablet, Rfl: 0   metoCLOPramide (REGLAN) 10 MG tablet, Take 1 tablet (10 mg total) by mouth at bedtime., Disp: 30 tablet, Rfl: 3   Omeprazole Magnesium 10 MG PACK, Take 10 mg by mouth daily., Disp: , Rfl:    sertraline (ZOLOFT) 50 MG tablet, Take 1 tablet (50 mg total) by mouth in the morning and at bedtime., Disp: 180 tablet, Rfl: 3   Objective:     There were no vitals filed for this visit.    There is no height or weight on file to calculate BMI.    Physical Exam:    ***   Electronically signed by:  Erika Burns D.[facility] 8:43 AM 01/10/23

## 2023-01-19 ENCOUNTER — Other Ambulatory Visit: Payer: Self-pay | Admitting: Sports Medicine

## 2023-01-20 ENCOUNTER — Ambulatory Visit: Payer: Self-pay | Admitting: Sports Medicine

## 2023-02-02 NOTE — Progress Notes (Signed)
Erika Burns D.[facility] [address] [place] [address] Phone: [phone]   Assessment and Plan:     1. Primary osteoarthritis of right hip 2. Right hip pain  -Chronic with exacerbation, subsequent visit - Overall decrease in flare of right hip osteoarthritis after intra-articular CSI at previous office visit on 12/23/2022, though patient continues to have restriction in range of motion and pain with end ranges of motion consistent with osteoarthritis - Continue treatment of underlying osteoporosis with Boniva - Discontinue meloxicam.  We discussed that patient has history of gastric surgery in 2005 and should not use chronic NSAIDs - Start Tylenol 500 to 1000 mg tablets 2-3 times a day for day-to-day pain relief - Continue HEP for hip  Pertinent previous records reviewed include none  Follow Up: As needed if no improvement or worsening of symptoms.  Could consider physical therapy versus repeat intra-articular CSI, ideally 03/23/2023 for later.  Patient may ultimately benefit from total hip replacement, though we will continue conservative therapy at this time   Subjective:   I, Erika Burns, am serving as a Neurosurgeon for Doctor Richardean Sale   Chief Complaint: hip pain    HPI:    01/07/2022 Patient is a 58 year old female complaining of hip pain. Patient states intermittent pain for a year , right hip pain , when she abducts she has sharp pain, states she has osteoporosis of the right hip takes boniva 1 month has been on 4 months now , at night she gets radiating pain down her leg ,no pain in flexion , ib for the pain and that seems to helps a little , no numbness or tingling, has flares that will last for a little bit of time     12/23/2022 Patient states that she is still in pain since the last time we saw her at that time the pain was intermittent now the pain is constant she has decrease ROM and pain with ADLs   02/03/2023 Patient  states CSI helped . She isnt limping, still has some decreased ROM    Relevant Historical Information: Osteoporosis on Boniva  Additional pertinent review of systems negative.   Current Outpatient Medications:    Ascorbic Acid (VITAMIN C) 100 MG tablet, Take 100 mg by mouth daily., Disp: , Rfl:    busPIRone (BUSPAR) 7.5 MG tablet, Take 1 tablet (7.5 mg total) by mouth 2 (two) times daily., Disp: 180 tablet, Rfl: 3   Cholecalciferol (VITAMIN D3) 1.25 MG (50000 UT) CAPS, Take by mouth., Disp: , Rfl:    clonazePAM (KLONOPIN) 0.25 MG disintegrating tablet, Take 1-2 tablets (0.25-0.5 mg total) by mouth daily as needed (Severe anxiety)., Disp: 20 tablet, Rfl: 0   cyclobenzaprine (FLEXERIL) 5 MG tablet, Take 1-2 tablets (5-10 mg total) by mouth 3 (three) times daily as needed for muscle spasms., Disp: 30 tablet, Rfl: 2   ibandronate (BONIVA) 150 MG tablet, Take 1 tablet (150 mg total) by mouth every 30 (thirty) days., Disp: 12 tablet, Rfl: 0   MAGNESIUM OXIDE PO, Take by mouth., Disp: , Rfl:    meloxicam (MOBIC) 15 MG tablet, Take 1 tablet (15 mg total) by mouth daily., Disp: 30 tablet, Rfl: 0   metoCLOPramide (REGLAN) 10 MG tablet, Take 1 tablet (10 mg total) by mouth at bedtime., Disp: 30 tablet, Rfl: 3   Omeprazole Magnesium 10 MG PACK, Take 10 mg by mouth daily., Disp: , Rfl:    sertraline (ZOLOFT) 50 MG tablet, Take  1 tablet (50 mg total) by mouth in the morning and at bedtime., Disp: 180 tablet, Rfl: 3   Objective:     Vitals:   02/03/23 1021  BP: 118/78  Pulse: 68  SpO2: 97%  Weight: 123 lb (55.8 kg)  Height: 5\' 3"  (1.6 m)      Body mass index is 21.79 kg/m.    Physical Exam:    General: awake, alert, and oriented no acute distress, nontoxic Skin: no suspicious lesions or rashes Neuro:sensation intact distally with no dificits, normal muscle tone, no atrophy, strength 5/5 in all tested lower ext groups Psych: normal mood and affect, speech clear   Right hip: No deformity,  swelling or wasting ROM Flexion 90, ext 20, IR 30, ER 35 NTTP over the hip flexors, greater trochanter, gluteal musculature, si joint, lumbar spine Negative log roll with FROM Positive FABER Positive FADIR Negative Piriformis test,  Negative trendelenberg Gait normal     Electronically signed by:  Erika Burns D.[facility] 10:43 AM 02/03/23

## 2023-02-03 ENCOUNTER — Ambulatory Visit (INDEPENDENT_AMBULATORY_CARE_PROVIDER_SITE_OTHER): Payer: Self-pay | Admitting: Sports Medicine

## 2023-02-03 VITALS — BP 118/78 | HR 68 | Ht 63.0 in | Wt 123.0 lb

## 2023-02-03 DIAGNOSIS — M25551 Pain in right hip: Secondary | ICD-10-CM

## 2023-02-03 DIAGNOSIS — M1611 Unilateral primary osteoarthritis, right hip: Secondary | ICD-10-CM

## 2023-02-03 NOTE — Patient Instructions (Signed)
Tylenol [phone] mg 2-3 times a day for pain relief  As needed follow up for repeat injection, ideally mid march or later

## 2023-03-01 ENCOUNTER — Other Ambulatory Visit: Payer: Self-pay | Admitting: Family

## 2023-03-01 DIAGNOSIS — F411 Generalized anxiety disorder: Secondary | ICD-10-CM

## 2023-03-02 ENCOUNTER — Other Ambulatory Visit: Payer: Self-pay | Admitting: Family

## 2023-03-02 DIAGNOSIS — F411 Generalized anxiety disorder: Secondary | ICD-10-CM

## 2023-03-02 NOTE — Telephone Encounter (Signed)
Copied from CRM [id number]. Topic: Clinical - Medication Refill >> Mar 02, 2023  1:42 PM Orinda Kenner C wrote: Most Recent Primary Care Visit:  Provider: Dulce Sellar  Department: [facility]  Visit Type: OFFICE VISIT  Date: 10/07/2022  Medication: clonazePAM (KLONOPIN) 0.25 MG disintegrating tablet   Has the patient contacted their pharmacy? Yes (Agent: If no, request that the patient contact the pharmacy for the refill. If patient does not wish to contact the pharmacy document the reason why and proceed with request.) (Agent: If yes, when and what did the pharmacy advise?)  Is this the correct pharmacy for this prescription? Yes If no, delete pharmacy and type the correct one.  This is the patient's preferred pharmacy:  [id number] [place], [place] - [address] [place] [address] Phone: [phone] Fax: [phone]   Has the prescription been filled recently? No  Is the patient out of the medication? No. Patient has only 1 pill left and will fly out town this Saturday.  Has the patient been seen for an appointment in the last year OR does the patient have an upcoming appointment? Yes  Can we respond through MyChart? Yes  Agent: Please be advised that Rx refills may take up to 3 business days. We ask that you follow-up with your pharmacy.

## 2023-03-02 NOTE — Telephone Encounter (Signed)
needs office visit as controlled substance, ok if virtual

## 2023-03-03 ENCOUNTER — Telehealth (INDEPENDENT_AMBULATORY_CARE_PROVIDER_SITE_OTHER): Payer: Self-pay | Admitting: Family

## 2023-03-03 ENCOUNTER — Encounter: Payer: Self-pay | Admitting: Family

## 2023-03-03 DIAGNOSIS — F411 Generalized anxiety disorder: Secondary | ICD-10-CM

## 2023-03-03 MED ORDER — CLONAZEPAM 0.25 MG PO TBDP
0.2500 mg | ORAL_TABLET | Freq: Every day | ORAL | 1 refills | Status: DC | PRN
Start: 2023-03-03 — End: 2023-10-13

## 2023-03-03 NOTE — Progress Notes (Signed)
MyChart Video Visit    Virtual Visit via Video Note   This format is felt to be most appropriate for this patient at this time. Physical exam was limited by quality of the video and audio technology used for the visit. CMA was able to get the patient set up on a video visit.  Patient location: Home. Patient and provider in visit Provider location: Office  I discussed the limitations of evaluation and management by telemedicine and the availability of in person appointments. The patient expressed understanding and agreed to proceed.  Visit Date: 03/03/2023  Today's healthcare provider: Dulce Sellar, NP     Subjective:   Patient ID: Erika Burns, female    DOB: 1965/03/08, 58 y.o.   MRN: [id number]  Chief Complaint  Patient presents with   Anxiety   Anxiety:   pt reports taking Zoloft up until last year, up to 150mg , but made her very sleepy &  switched to Celexa and it made her feel worse, more tense. 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 Recently restarted 50mg  tabs she had left over. *Update - pt reports increasing zoloft but was not working for her so she stopped taking, has since had more stress r/t her dtr having postpartum depression; she has taken Lexapro in past that also didn't work w/old PCP. Restarted on Zoloft, taking 50mg  bid, and Buspirone 7.5mg  bid, Clonazepam 0.25mg  prn and doing better.  Assessment & Plan:  Generalized anxiety disorder Assessment & Plan: chronic, stable on Buspar 7.5mg  bid, Zoloft 50mg  bid & Clonazepma 0.25mg  prn, denies any SE sending refill for Clonazepam f/u 90m-46yr depending on Clonazepam refill  Orders: -     clonazePAM; Take 1-2 tablets (0.25-0.5 mg total) by mouth daily as needed (Severe anxiety).  Dispense: 20 tablet; Refill: 1   Past Medical History:  Diagnosis Date   Abdominal pain in female patient 04/14/2012   Bowel obstruction [facility])    Osteoporosis     Past Surgical History:  Procedure  Laterality Date   ABDOMINAL SURGERY  2005   repair of bowel obstruction   BREAST SURGERY  2004   breast implants   ESOPHAGOGASTRODUODENOSCOPY N/A 04/15/2012   Procedure: ESOPHAGOGASTRODUODENOSCOPY (EGD);  Surgeon: Petra Kuba, MD;  Location: [facility] ENDOSCOPY;  Service: Endoscopy;  Laterality: N/A;    Outpatient Medications Prior to Visit  Medication Sig Dispense Refill   Ascorbic Acid (VITAMIN C) 100 MG tablet Take 100 mg by mouth daily.     busPIRone (BUSPAR) 7.5 MG tablet Take 1 tablet (7.5 mg total) by mouth 2 (two) times daily. 180 tablet 3   Cholecalciferol (VITAMIN D3) 1.25 MG (50000 UT) CAPS Take by mouth.     cyclobenzaprine (FLEXERIL) 5 MG tablet Take 1-2 tablets (5-10 mg total) by mouth 3 (three) times daily as needed for muscle spasms. 30 tablet 2   ibandronate (BONIVA) 150 MG tablet Take 1 tablet (150 mg total) by mouth every 30 (thirty) days. 12 tablet 0   MAGNESIUM OXIDE PO Take by mouth.     metoCLOPramide (REGLAN) 10 MG tablet Take 1 tablet (10 mg total) by mouth at bedtime. 30 tablet 3   Omeprazole Magnesium 10 MG PACK Take 10 mg by mouth daily.     sertraline (ZOLOFT) 50 MG tablet Take 1 tablet (50 mg total) by mouth in the morning and at bedtime. 180 tablet 3   clonazePAM (KLONOPIN) 0.25 MG disintegrating tablet Take 1-2 tablets (0.25-0.5 mg  total) by mouth daily as needed (Severe anxiety). 20 tablet 0   No facility-administered medications prior to visit.    No Known Allergies     Objective:   Physical Exam Vitals and nursing note reviewed.  Constitutional:      General: Pt is not in acute distress.    Appearance: Normal appearance.  HENT:     Head: Normocephalic.  Pulmonary:     Effort: No respiratory distress.  Musculoskeletal:     Cervical back: Normal range of motion.  Skin:    General: Skin is dry.     Coloration: Skin is not pale.  Neurological:     Mental Status: Pt is alert and oriented to person, place, and time.  Psychiatric:        Mood and  Affect: Mood normal.   There were no vitals taken for this visit.  Wt Readings from Last 3 Encounters:  02/03/23 123 lb (55.8 kg)  12/23/22 123 lb (55.8 kg)  06/23/22 124 lb 6.4 oz (56.4 kg)      I discussed the assessment and treatment plan with the patient. The patient was provided an opportunity to ask questions and all were answered. The patient agreed with the plan and demonstrated an understanding of the instructions.   The patient was advised to call back or seek an in-person evaluation if the symptoms worsen or if the condition fails to improve as anticipated.  Dulce Sellar, NP [facility] [phone] (phone) [phone] (fax)  [facility]

## 2023-03-03 NOTE — Assessment & Plan Note (Signed)
chronic, stable on Buspar 7.5mg  bid, Zoloft 50mg  bid & Clonazepma 0.25mg  prn, denies any SE sending refill for Clonazepam f/u 65m-104yr depending on Clonazepam refill

## 2023-04-25 NOTE — Progress Notes (Unsigned)
    Aleen Sells D.[facility] [address] [place] [address] Phone: [phone]   Assessment and Plan:     There are no diagnoses linked to this encounter.  ***   Pertinent previous records reviewed include ***    Follow Up: ***     Subjective:   I, Erika Burns, am serving as a Neurosurgeon for Doctor Richardean Sale   Chief Complaint: hip pain    HPI:    01/07/2022 Patient is a 58 year old female complaining of hip pain. Patient states intermittent pain for a year , right hip pain , when she abducts she has sharp pain, states she has osteoporosis of the right hip takes boniva 1 month has been on 4 months now , at night she gets radiating pain down her leg ,no pain in flexion , ib for the pain and that seems to helps a little , no numbness or tingling, has flares that will last for a little bit of time     12/23/2022 Patient states that she is still in pain since the last time we saw her at that time the pain was intermittent now the pain is constant she has decrease ROM and pain with ADLs    02/03/2023 Patient states CSI helped . She isnt limping, still has some decreased ROM   04/26/2023 Patient states   Relevant Historical Information: Osteoporosis on Boniva  Additional pertinent review of systems negative.   Current Outpatient Medications:    Ascorbic Acid (VITAMIN C) 100 MG tablet, Take 100 mg by mouth daily., Disp: , Rfl:    busPIRone (BUSPAR) 7.5 MG tablet, Take 1 tablet (7.5 mg total) by mouth 2 (two) times daily., Disp: 180 tablet, Rfl: 3   Cholecalciferol (VITAMIN D3) 1.25 MG (50000 UT) CAPS, Take by mouth., Disp: , Rfl:    clonazePAM (KLONOPIN) 0.25 MG disintegrating tablet, Take 1-2 tablets (0.25-0.5 mg total) by mouth daily as needed (Severe anxiety)., Disp: 20 tablet, Rfl: 1   cyclobenzaprine (FLEXERIL) 5 MG tablet, Take 1-2 tablets (5-10 mg total) by mouth 3 (three) times daily as needed for muscle spasms., Disp: 30  tablet, Rfl: 2   ibandronate (BONIVA) 150 MG tablet, Take 1 tablet (150 mg total) by mouth every 30 (thirty) days., Disp: 12 tablet, Rfl: 0   MAGNESIUM OXIDE PO, Take by mouth., Disp: , Rfl:    metoCLOPramide (REGLAN) 10 MG tablet, Take 1 tablet (10 mg total) by mouth at bedtime., Disp: 30 tablet, Rfl: 3   Omeprazole Magnesium 10 MG PACK, Take 10 mg by mouth daily., Disp: , Rfl:    sertraline (ZOLOFT) 50 MG tablet, Take 1 tablet (50 mg total) by mouth in the morning and at bedtime., Disp: 180 tablet, Rfl: 3   Objective:     There were no vitals filed for this visit.    There is no height or weight on file to calculate BMI.    Physical Exam:    ***   Electronically signed by:  Aleen Sells D.[facility] 1:51 PM 04/25/23

## 2023-04-26 ENCOUNTER — Other Ambulatory Visit: Payer: Self-pay

## 2023-04-26 ENCOUNTER — Ambulatory Visit: Payer: Self-pay | Admitting: Sports Medicine

## 2023-04-26 VITALS — HR 65 | Ht 63.0 in | Wt 123.0 lb

## 2023-04-26 DIAGNOSIS — M25551 Pain in right hip: Secondary | ICD-10-CM

## 2023-04-26 DIAGNOSIS — M1611 Unilateral primary osteoarthritis, right hip: Secondary | ICD-10-CM

## 2023-04-26 NOTE — Patient Instructions (Signed)
 Ortho referral  Continue tylenol as needed  Recommend providing our front desk or orhto with your new insurance As needed follow up for repeat injection

## 2023-05-19 ENCOUNTER — Ambulatory Visit: Payer: Self-pay | Admitting: Orthopaedic Surgery

## 2023-05-19 ENCOUNTER — Telehealth: Payer: Self-pay

## 2023-05-19 ENCOUNTER — Other Ambulatory Visit (INDEPENDENT_AMBULATORY_CARE_PROVIDER_SITE_OTHER): Payer: Self-pay

## 2023-05-19 DIAGNOSIS — M25551 Pain in right hip: Secondary | ICD-10-CM | POA: Diagnosis not present

## 2023-05-19 DIAGNOSIS — M1611 Unilateral primary osteoarthritis, right hip: Secondary | ICD-10-CM | POA: Diagnosis not present

## 2023-05-19 NOTE — Telephone Encounter (Signed)
 sounds, good, yes I can discuss treatment also.

## 2023-05-19 NOTE — Progress Notes (Signed)
 The patient is a very pleasant and active 58 year old female who was sent to me from Luther Saltness, NP to evaluate and treat known arthritis of the right hip.  This has been getting worse for about 3 years now but over the last several months has gotten significantly worse.  She walks with significant limp and a Trendelenburg gait.  She has right hip groin pain and pain on the lateral aspect of her hip.  She is feels like it gives way at times.  Standing long present times in 100 grandson hurts her quite a bit.  She is a thin and petite individual.  She has had injections over the trochanteric area of her hip with a steroid as well as an intra-articular injection.  She is not having significant medical problems at all and she is not a diabetic.  I did review all of her past medical history and medications within epic as well.  There are no active medical issues.  When I have her lay in the supine position her leg lengths are near equal.  Her left hip moves smoothly and fluidly.  Her right hip has significant decrease in rotation in terms of internal and external rotation and severe pain in the groin with attempts or rotation.  Standing AP pelvis and lateral the right hip shows bone-on-bone wear of the right hip with complete loss of the joint space as well as sclerotic and cystic changes and peritracheal osteophytes.  We had a long thorough discussion about hip replacement surgery.  I discussed the risks and benefits of the surgery and what to expect from an intraoperative and postoperative standpoint.  We talked about how hip replacements are performed and went over hip replacement model.  We gave her handout about hip replacement surgery in detail.  All questions and concerns were answered and addressed.  Will work on getting this scheduled for later in June after they have a trip to the beach.  Her husband is with her today as well.

## 2023-05-19 NOTE — Telephone Encounter (Signed)
 Copied from CRM [id number]. Topic: Clinical - Medical Advice >> May 19, 2023 10:57 AM Juleen Oakland F wrote: Reason for CRM: Patient would like hormone replacement therapy for menopause - wants to know if she should see Versa Gore or her OB/GYN?   FYI, I returned pt's call, Pt states she will contact her OBGYN to see if they can still see her due to it being a couple of years if not she will call back to schedule an appointment to discuss treatment with Versa Gore, NP.

## 2023-06-23 NOTE — Progress Notes (Signed)
 Surgical Instructions   Your procedure is scheduled on July 05, 2023. Report to [facility] Main Entrance "A" at 5:30 A.M., then check in with the Admitting office. Any questions or running late day of surgery: call [phone]  Questions prior to your surgery date: call [phone], Monday-Friday, 8am-4pm. If you experience any cold or flu symptoms such as cough, fever, chills, shortness of breath, etc. between now and your scheduled surgery, please notify us  at the above number.     Remember:  Do not eat after midnight the night before your surgery  You may drink clear liquids until 4:30 the morning of your surgery.   Clear liquids allowed are: Water, Non-Citrus Juices (without pulp), Carbonated Beverages, Clear Tea (no milk, honey, etc.), Black Coffee Only (NO MILK, CREAM OR POWDERED CREAMER of any kind), and Gatorade. Patient Instructions  The night before surgery:  No food after midnight. ONLY clear liquids after midnight  The day of surgery (if you do NOT have diabetes):  Drink ONE (1) Pre-Surgery Clear Ensure by 4:30 the morning of surgery. Drink in one sitting. Do not sip.  This drink was given to you during your hospital  pre-op appointment visit.  Nothing else to drink after completing the  Pre-Surgery Clear Ensure.          If you have questions, please contact your surgeon's office.    Take these medicines the morning of surgery with A SIP OF WATER  busPIRone  (BUSPAR )  omeprazole (PRILOSEC OTC)  progesterone (PROMETRIUM)  sertraline  (ZOLOFT )   May take these medicines IF NEEDED: clonazePAM  (KLONOPIN )    One week prior to surgery, STOP taking any Aspirin (unless otherwise instructed by your surgeon) Aleve, Naproxen, Ibuprofen, Motrin, Advil, Goody's, BC's, all herbal medications, fish oil, and non-prescription vitamins.                     Do NOT Smoke (Tobacco/Vaping) for 24 hours prior to your procedure.  If you use a CPAP at night, you may bring your  mask/headgear for your overnight stay.   You will be asked to remove any contacts, glasses, piercing's, hearing aid's, dentures/partials prior to surgery. Please bring cases for these items if needed.    Patients discharged the day of surgery will not be allowed to drive home, and someone needs to stay with them for 24 hours.  SURGICAL WAITING ROOM VISITATION Patients may have no more than 2 support people in the waiting area - these visitors may rotate.   Pre-op nurse will coordinate an appropriate time for 1 ADULT support person, who may not rotate, to accompany patient in pre-op.  Children under the age of 72 must have an adult with them who is not the patient and must remain in the main waiting area with an adult.  If the patient needs to stay at the hospital during part of their recovery, the visitor guidelines for inpatient rooms apply.  Please refer to the [facility] website for the visitor guidelines for any additional information.   If you received a COVID test during your pre-op visit  it is requested that you wear a mask when out in public, stay away from anyone that may not be feeling well and notify your surgeon if you develop symptoms. If you have been in contact with anyone that has tested positive in the last 10 days please notify you surgeon.      Pre-operative 5 CHG Bathing Instructions   You can play a key  role in reducing the risk of infection after surgery. Your skin needs to be as free of germs as possible. You can reduce the number of germs on your skin by washing with CHG (chlorhexidine gluconate) soap before surgery. CHG is an antiseptic soap that kills germs and continues to kill germs even after washing.   DO NOT use if you have an allergy to chlorhexidine/CHG or antibacterial soaps. If your skin becomes reddened or irritated, stop using the CHG and notify one of our RNs at [phone].   Please shower with the CHG soap starting 4 days before surgery using the  following schedule:     Please keep in mind the following:  DO NOT shave, including legs and underarms, starting the day of your first shower.   You may shave your face at any point before/day of surgery.  Place clean sheets on your bed the day you start using CHG soap. Use a clean washcloth (not used since being washed) for each shower. DO NOT sleep with pets once you start using the CHG.   CHG Shower Instructions:  Wash your face and private area with normal soap. If you choose to wash your hair, wash first with your normal shampoo.  After you use shampoo/soap, rinse your hair and body thoroughly to remove shampoo/soap residue.  Turn the water OFF and apply about 3 tablespoons (45 ml) of CHG soap to a CLEAN washcloth.  Apply CHG soap ONLY FROM YOUR NECK DOWN TO YOUR TOES (washing for 3-5 minutes)  DO NOT use CHG soap on face, private areas, open wounds, or sores.  Pay special attention to the area where your surgery is being performed.  If you are having back surgery, having someone wash your back for you may be helpful. Wait 2 minutes after CHG soap is applied, then you may rinse off the CHG soap.  Pat dry with a clean towel  Put on clean clothes/pajamas   If you choose to wear lotion, please use ONLY the CHG-compatible lotions that are listed below.  Additional instructions for the day of surgery: DO NOT APPLY any lotions, deodorants, cologne, or perfumes.   Do not bring valuables to the hospital. [facility] is not responsible for any belongings/valuables. Do not wear nail polish, gel polish, artificial nails, or any other type of covering on natural nails (fingers and toes) Do not wear jewelry or makeup Put on clean/comfortable clothes.  Please brush your teeth.  Ask your nurse before applying any prescription medications to the skin.     CHG Compatible Lotions   Aveeno Moisturizing lotion  Cetaphil Moisturizing Cream  Cetaphil Moisturizing Lotion  Clairol Herbal  Essence Moisturizing Lotion, Dry Skin  Clairol Herbal Essence Moisturizing Lotion, Extra Dry Skin  Clairol Herbal Essence Moisturizing Lotion, Normal Skin  Curel Age Defying Therapeutic Moisturizing Lotion with Alpha Hydroxy  Curel Extreme Care Body Lotion  Curel Soothing Hands Moisturizing Hand Lotion  Curel Therapeutic Moisturizing Cream, Fragrance-Free  Curel Therapeutic Moisturizing Lotion, Fragrance-Free  Curel Therapeutic Moisturizing Lotion, Original Formula  Eucerin Daily Replenishing Lotion  Eucerin Dry Skin Therapy Plus Alpha Hydroxy Crme  Eucerin Dry Skin Therapy Plus Alpha Hydroxy Lotion  Eucerin Original Crme  Eucerin Original Lotion  Eucerin Plus Crme Eucerin Plus Lotion  Eucerin TriLipid Replenishing Lotion  Keri Anti-Bacterial Hand Lotion  Keri Deep Conditioning Original Lotion Dry Skin Formula Softly Scented  Keri Deep Conditioning Original Lotion, Fragrance Free Sensitive Skin Formula  Keri Lotion Fast Absorbing Fragrance Free Sensitive Skin Formula  Keri Lotion Fast Absorbing Softly Scented Dry Skin Formula  Keri Original Lotion  Keri Skin Renewal Lotion Keri Silky Smooth Lotion  Keri Silky Smooth Sensitive Skin Lotion  Nivea Body Creamy Conditioning Patent examiner Moisturizing Lotion Nivea Crme  Nivea Skin Firming Lotion  NutraDerm 30 Skin Lotion  NutraDerm Skin Lotion  NutraDerm Therapeutic Skin Cream  NutraDerm Therapeutic Skin Lotion  ProShield Protective Hand Cream  Provon moisturizing lotion  Please read over the following fact sheets that you were given.

## 2023-06-24 ENCOUNTER — Encounter (HOSPITAL_COMMUNITY)
Admission: RE | Admit: 2023-06-24 | Discharge: 2023-06-24 | Disposition: A | Discharge status: Home / Self Care | Source: Ambulatory Visit | Attending: Orthopaedic Surgery | Admitting: Orthopaedic Surgery

## 2023-06-24 ENCOUNTER — Other Ambulatory Visit: Payer: Self-pay

## 2023-06-24 ENCOUNTER — Encounter (HOSPITAL_COMMUNITY): Payer: Self-pay

## 2023-06-24 VITALS — BP 117/83 | HR 64 | Temp 98.1°F | Resp 18

## 2023-06-24 DIAGNOSIS — Z01812 Encounter for preprocedural laboratory examination: Secondary | ICD-10-CM | POA: Insufficient documentation

## 2023-06-24 DIAGNOSIS — Z01818 Encounter for other preprocedural examination: Secondary | ICD-10-CM

## 2023-06-24 DIAGNOSIS — M1611 Unilateral primary osteoarthritis, right hip: Secondary | ICD-10-CM | POA: Insufficient documentation

## 2023-06-24 LAB — TYPE AND SCREEN
ABO/RH(D): B POS
Antibody Screen: NEGATIVE

## 2023-06-24 LAB — SURGICAL PCR SCREEN
MRSA, PCR: NEGATIVE
Staphylococcus aureus: NEGATIVE

## 2023-06-24 LAB — BASIC METABOLIC PANEL WITH GFR
Anion gap: 13 (ref 5–15)
BUN: 18 mg/dL (ref 6–20)
CO2: 27 mmol/L (ref 22–32)
Calcium: 9.3 mg/dL (ref 8.9–10.3)
Chloride: 98 mmol/L (ref 98–111)
Creatinine, Ser: 0.67 mg/dL (ref 0.44–1.00)
GFR, Estimated: >60 mL/min (ref >60)
Glucose, Bld: 105 mg/dL — ABNORMAL HIGH (ref 70–99)
Potassium: 4.3 mmol/L (ref 3.5–5.1)
Sodium: 138 mmol/L (ref 135–145)

## 2023-06-24 LAB — CBC
HCT: 41.6 % (ref 36.0–46.0)
Hemoglobin: 13.7 g/dL (ref 12.0–15.0)
MCH: 28.8 pg (ref 26.0–34.0)
MCHC: 32.9 g/dL (ref 30.0–36.0)
MCV: 87.6 fL (ref 80.0–100.0)
Platelets: 408 10*3/uL — ABNORMAL HIGH (ref 150–400)
RBC: 4.75 MIL/uL (ref 3.87–5.11)
RDW: 12.6 % (ref 11.5–15.5)
WBC: 6.2 10*3/uL (ref 4.0–10.5)
nRBC: 0 % (ref 0.0–0.2)

## 2023-06-24 NOTE — Progress Notes (Signed)
 PCP - Versa Gore Cardiologist - Denies  PPM/ICD - Denies Device Orders - N/A Rep Notified - N/A  Chest x-ray - N/A EKG - N/A Stress Test - Denies ECHO - Denies Cardiac Cath - Denies  Sleep Study - Denies CPAP - N/A  Fasting Blood Sugar - Denies Checks Blood Sugar _____ times a day: N/A  Last dose of GLP1 agonist-  Denies GLP1 instructions: N/A  Blood Thinner Instructions:Denies Aspirin Instructions:Denies  ERAS Protcol -Denies PRE-SURGERY Ensure or G2-   COVID TEST- N/A   Anesthesia review: No  Patient denies shortness of breath, fever, cough and chest pain at PAT appointment. Patient denies any respiratory issues.   All instructions explained to the patient, with a verbal understanding of the material. Patient agrees to go over the instructions while at home for a better understanding. Patient also instructed to self quarantine after being tested for COVID-19. The opportunity to ask questions was provided.

## 2023-06-25 ENCOUNTER — Other Ambulatory Visit: Payer: Self-pay | Admitting: Family

## 2023-06-25 DIAGNOSIS — M810 Age-related osteoporosis without current pathological fracture: Secondary | ICD-10-CM

## 2023-07-04 NOTE — Anesthesia Preprocedure Evaluation (Signed)
 Anesthesia Evaluation    Reviewed: Allergy & Precautions, Patient's Chart, lab work & pertinent test results  Airway Mallampati: III  TM Distance: >3 FB Neck ROM: Full    Dental no notable dental hx. (+) Teeth Intact, Dental Advisory Given   Pulmonary neg pulmonary ROS   Pulmonary exam normal breath sounds clear to auscultation       Cardiovascular negative cardio ROS Normal cardiovascular exam Rhythm:Regular Rate:Normal     Neuro/Psych  PSYCHIATRIC DISORDERS Anxiety     negative neurological ROS     GI/Hepatic negative GI ROS, Neg liver ROS,,,  Endo/Other  negative endocrine ROS    Renal/GU negative Renal ROS  negative genitourinary   Musculoskeletal  (+) Arthritis ,    Abdominal   Peds  Hematology negative hematology ROS (+)   Anesthesia Other Findings   Reproductive/Obstetrics                             Anesthesia Physical Anesthesia Plan  ASA: 2  Anesthesia Plan: Spinal   Post-op Pain Management: Tylenol  PO (pre-op)*   Induction:   PONV Risk Score and Plan: 2 and Treatment may vary due to age or medical condition, Midazolam , Propofol infusion, Ondansetron  and Dexamethasone  Airway Management Planned: Natural Airway  Additional Equipment:   Intra-op Plan:   Post-operative Plan:   Informed Consent: I have reviewed the patients History and Physical, chart, labs and discussed the procedure including the risks, benefits and alternatives for the proposed anesthesia with the patient or authorized representative who has indicated his/her understanding and acceptance.     Dental advisory given  Plan Discussed with: CRNA  Anesthesia Plan Comments:        Anesthesia Quick Evaluation

## 2023-07-04 NOTE — H&P (Signed)
 TOTAL HIP ADMISSION H&P  Patient is admitted for right total hip arthroplasty.  Subjective:  Chief Complaint: right hip pain  HPI: Erika Burns, 58 y.o. female, has a history of pain and functional disability in the right hip(s) due to arthritis and patient has failed non-surgical conservative treatments for greater than 12 weeks to include NSAID's and/or analgesics, corticosteriod injections, flexibility and strengthening excercises, and activity modification.  Onset of symptoms was gradual starting 3 years ago with gradually worsening course since that time.The patient noted no past surgery on the right hip(s).  Patient currently rates pain in the right hip at 10 out of 10 with activity. Patient has night pain, worsening of pain with activity and weight bearing, trendelenberg gait, pain that interfers with activities of daily living, and pain with passive range of motion. Patient has evidence of subchondral cysts, subchondral sclerosis, periarticular osteophytes, and joint space narrowing by imaging studies. This condition presents safety issues increasing the risk of falls.  There is no current active infection.  Patient Active Problem List   Diagnosis Date Noted   Gastroparesis 06/23/2022   Unilateral primary osteoarthritis, right hip 06/23/2022   Cervicalgia 05/17/2022   Age-related osteoporosis without current pathological fracture 11/30/2021   Generalized anxiety disorder 11/17/2021   Past Medical History:  Diagnosis Date   Abdominal pain in female patient 04/14/2012   Bowel obstruction [facility])    Osteoporosis     Past Surgical History:  Procedure Laterality Date   ABDOMINAL SURGERY  2005   repair of bowel obstruction   BREAST SURGERY  2004   breast implants   ESOPHAGOGASTRODUODENOSCOPY N/A 04/15/2012   Procedure: ESOPHAGOGASTRODUODENOSCOPY (EGD);  Surgeon: Mathew Solomon, MD;  Location: [facility] ENDOSCOPY;  Service: Endoscopy;  Laterality: N/A;    No current facility-administered  medications for this encounter.   Current Outpatient Medications  Medication Sig Dispense Refill Last Dose/Taking   busPIRone  (BUSPAR ) 7.5 MG tablet Take 1 tablet (7.5 mg total) by mouth 2 (two) times daily. (Patient taking differently: Take 7.5 mg by mouth daily.) 180 tablet 3 Taking Differently   Cholecalciferol (VITAMIN D3) 10 MCG (400 UNIT) tablet Take 800 Units by mouth daily.   Taking   clonazePAM  (KLONOPIN ) 0.25 MG disintegrating tablet Take 1-2 tablets (0.25-0.5 mg total) by mouth daily as needed (Severe anxiety). 20 tablet 1 Taking As Needed   estradiol (VIVELLE-DOT) 0.05 MG/24HR patch Place 1 patch onto the skin 2 (two) times a week.   Taking   ibandronate  (BONIVA ) 150 MG tablet Take 1 tablet (150 mg total) by mouth every 30 (thirty) days. 12 tablet 0 Taking   MAGNESIUM OXIDE PO Take 1 tablet by mouth daily.   Taking   omeprazole (PRILOSEC OTC) 20 MG tablet Take 20 mg by mouth daily.   Taking   progesterone (PROMETRIUM) 100 MG capsule Take 100 mg by mouth daily.   Taking   sertraline  (ZOLOFT ) 50 MG tablet Take 1 tablet (50 mg total) by mouth in the morning and at bedtime. 180 tablet 3 Taking   cyclobenzaprine  (FLEXERIL ) 5 MG tablet Take 1-2 tablets (5-10 mg total) by mouth 3 (three) times daily as needed for muscle spasms. (Patient not taking: Reported on 06/22/2023) 30 tablet 2 Not Taking   metoCLOPramide  (REGLAN ) 10 MG tablet Take 1 tablet (10 mg total) by mouth at bedtime. (Patient not taking: Reported on 06/22/2023) 30 tablet 3 Not Taking   No Known Allergies  Social History   Tobacco Use   Smoking status: Never  Smokeless tobacco: Not on file  Substance Use Topics   Alcohol use: Yes    Comment: occasionally    Family History  Problem Relation Age of Onset   Heart disease Mother    Depression Mother    Hypertension Father    Diabetes Father    Depression Father    Heart disease Brother    Learning disabilities Son      Review of Systems  Objective:  Physical  Exam Vitals reviewed.  Constitutional:      Appearance: Normal appearance. She is normal weight.  HENT:     Head: Normocephalic and atraumatic.   Eyes:     Extraocular Movements: Extraocular movements intact.     Pupils: Pupils are equal, round, and reactive to light.    Cardiovascular:     Rate and Rhythm: Normal rate and regular rhythm.     Pulses: Normal pulses.  Pulmonary:     Effort: Pulmonary effort is normal.     Breath sounds: Normal breath sounds.  Abdominal:     Palpations: Abdomen is soft.   Musculoskeletal:     Cervical back: Normal range of motion and neck supple.     Right hip: Tenderness and bony tenderness present. Decreased range of motion. Decreased strength.   Neurological:     Mental Status: She is alert and oriented to person, place, and time.   Psychiatric:        Behavior: Behavior normal.     Vital signs in last 24 hours:    Labs:   Estimated body mass index is 21.79 kg/m as calculated from the following:   Height as of 04/26/23: 5' 3 (1.6 m).   Weight as of 04/26/23: 55.8 kg.   Imaging Review Plain radiographs demonstrate severe degenerative joint disease of the right hip(s). The bone quality appears to be excellent for age and reported activity level.      Assessment/Plan:  End stage arthritis, right hip(s)  The patient history, physical examination, clinical judgement of the provider and imaging studies are consistent with end stage degenerative joint disease of the right hip(s) and total hip arthroplasty is deemed medically necessary. The treatment options including medical management, injection therapy, arthroscopy and arthroplasty were discussed at length. The risks and benefits of total hip arthroplasty were presented and reviewed. The risks due to aseptic loosening, infection, stiffness, dislocation/subluxation,  thromboembolic complications and other imponderables were discussed.  The patient acknowledged the explanation, agreed to  proceed with the plan and consent was signed. Patient is being admitted for inpatient treatment for surgery, pain control, PT, OT, prophylactic antibiotics, VTE prophylaxis, progressive ambulation and ADL's and discharge planning.The patient is planning to be discharged home with home health services

## 2023-07-05 ENCOUNTER — Ambulatory Visit (HOSPITAL_COMMUNITY): Payer: Self-pay | Admitting: Anesthesiology

## 2023-07-05 ENCOUNTER — Other Ambulatory Visit: Payer: Self-pay

## 2023-07-05 ENCOUNTER — Ambulatory Visit (HOSPITAL_COMMUNITY)

## 2023-07-05 ENCOUNTER — Encounter (HOSPITAL_COMMUNITY)
Admission: RE | Disposition: A | Discharge status: Home / Self Care | Payer: Self-pay | Source: Ambulatory Visit | Attending: Orthopaedic Surgery

## 2023-07-05 ENCOUNTER — Observation Stay (HOSPITAL_COMMUNITY)

## 2023-07-05 ENCOUNTER — Observation Stay (HOSPITAL_COMMUNITY)
Admission: RE | Admit: 2023-07-05 | Discharge: 2023-07-06 | Disposition: A | Discharge status: Home / Self Care | Source: Ambulatory Visit | Attending: Orthopaedic Surgery | Admitting: Orthopaedic Surgery

## 2023-07-05 ENCOUNTER — Encounter (HOSPITAL_COMMUNITY): Payer: Self-pay | Admitting: Orthopaedic Surgery

## 2023-07-05 DIAGNOSIS — M1611 Unilateral primary osteoarthritis, right hip: Principal | ICD-10-CM | POA: Diagnosis present

## 2023-07-05 DIAGNOSIS — Z96641 Presence of right artificial hip joint: Secondary | ICD-10-CM

## 2023-07-05 DIAGNOSIS — M542 Cervicalgia: Secondary | ICD-10-CM

## 2023-07-05 HISTORY — PX: TOTAL HIP ARTHROPLASTY: SHX124

## 2023-07-05 LAB — ABO/RH: ABO/RH(D): B POS

## 2023-07-05 SURGERY — ARTHROPLASTY, HIP, TOTAL, ANTERIOR APPROACH
Anesthesia: Spinal | Site: Hip | Laterality: Right

## 2023-07-05 MED ORDER — FENTANYL CITRATE (PF) 250 MCG/5ML IJ SOLN
INTRAMUSCULAR | Status: DC | PRN
  Administered 2023-07-05: 50 ug via INTRAVENOUS

## 2023-07-05 MED ORDER — TRANEXAMIC ACID-NACL 1000-0.7 MG/100ML-% IV SOLN
1000.0000 mg | INTRAVENOUS | Status: AC
  Administered 2023-07-05: 1000 mg via INTRAVENOUS
  Filled 2023-07-05: qty 100

## 2023-07-05 MED ORDER — EPHEDRINE SULFATE-NACL 50-0.9 MG/10ML-% IV SOSY
PREFILLED_SYRINGE | INTRAVENOUS | Status: DC | PRN
  Administered 2023-07-05 (×2): 5 mg via INTRAVENOUS

## 2023-07-05 MED ORDER — CHOLECALCIFEROL 10 MCG (400 UNIT) PO TABS
800.0000 [IU] | ORAL_TABLET | Freq: Every day | ORAL | Status: DC
  Administered 2023-07-06: 800 [IU] via ORAL
  Filled 2023-07-05: qty 2

## 2023-07-05 MED ORDER — CEFAZOLIN SODIUM-DEXTROSE 2-4 GM/100ML-% IV SOLN
2.0000 g | INTRAVENOUS | Status: AC
  Administered 2023-07-05: 2 g via INTRAVENOUS
  Filled 2023-07-05: qty 100

## 2023-07-05 MED ORDER — DIPHENHYDRAMINE HCL 12.5 MG/5ML PO ELIX: 12.5000 mg | ORAL_SOLUTION | ORAL | Status: DC | PRN

## 2023-07-05 MED ORDER — LACTATED RINGERS IV SOLN: INTRAVENOUS | Status: DC

## 2023-07-05 MED ORDER — PROPOFOL 10 MG/ML IV BOLUS
INTRAVENOUS | Status: DC | PRN
  Administered 2023-07-05: 20 mg via INTRAVENOUS

## 2023-07-05 MED ORDER — MENTHOL 3 MG MT LOZG: 1.0000 | LOZENGE | OROMUCOSAL | Status: DC | PRN

## 2023-07-05 MED ORDER — RIVAROXABAN 10 MG PO TABS
10.0000 mg | ORAL_TABLET | Freq: Every day | ORAL | Status: DC
  Administered 2023-07-06: 10 mg via ORAL
  Filled 2023-07-05: qty 1

## 2023-07-05 MED ORDER — MIDAZOLAM HCL 2 MG/2ML IJ SOLN
INTRAMUSCULAR | Status: DC | PRN
  Administered 2023-07-05 (×2): 1 mg via INTRAVENOUS

## 2023-07-05 MED ORDER — ACETAMINOPHEN 325 MG PO TABS
325.0000 mg | ORAL_TABLET | Freq: Four times a day (QID) | ORAL | Status: DC | PRN
  Administered 2023-07-05: 650 mg via ORAL
  Filled 2023-07-05: qty 2

## 2023-07-05 MED ORDER — METOCLOPRAMIDE HCL 5 MG PO TABS: 5.0000 mg | ORAL_TABLET | Freq: Three times a day (TID) | ORAL | Status: DC | PRN

## 2023-07-05 MED ORDER — ONDANSETRON HCL 4 MG PO TABS: 4.0000 mg | ORAL_TABLET | Freq: Four times a day (QID) | ORAL | Status: DC | PRN

## 2023-07-05 MED ORDER — OXYCODONE HCL 5 MG/5ML PO SOLN: 5.0000 mg | Freq: Once | ORAL | Status: DC | PRN

## 2023-07-05 MED ORDER — ALUM & MAG HYDROXIDE-SIMETH 200-200-20 MG/5ML PO SUSP: 30.0000 mL | ORAL | Status: DC | PRN

## 2023-07-05 MED ORDER — PROPOFOL 500 MG/50ML IV EMUL
INTRAVENOUS | Status: DC | PRN
  Administered 2023-07-05: 100 ug/kg/min via INTRAVENOUS

## 2023-07-05 MED ORDER — CLONAZEPAM 0.25 MG PO TBDP: 0.2500 mg | ORAL_TABLET | Freq: Every day | ORAL | Status: DC | PRN

## 2023-07-05 MED ORDER — PROGESTERONE MICRONIZED 100 MG PO CAPS
100.0000 mg | ORAL_CAPSULE | Freq: Every day | ORAL | Status: DC
  Administered 2023-07-05 – 2023-07-06 (×2): 100 mg via ORAL
  Filled 2023-07-05 (×2): qty 1

## 2023-07-05 MED ORDER — ONDANSETRON HCL 4 MG/2ML IJ SOLN
INTRAMUSCULAR | Status: AC
  Filled 2023-07-05: qty 2

## 2023-07-05 MED ORDER — PHENOL 1.4 % MT LIQD: 1.0000 | OROMUCOSAL | Status: DC | PRN

## 2023-07-05 MED ORDER — FENTANYL CITRATE (PF) 100 MCG/2ML IJ SOLN
25.0000 ug | INTRAMUSCULAR | Status: DC | PRN
  Administered 2023-07-05 (×2): 50 ug via INTRAVENOUS

## 2023-07-05 MED ORDER — CHLORHEXIDINE GLUCONATE 0.12 % MT SOLN
15.0000 mL | Freq: Once | OROMUCOSAL | Status: AC
  Administered 2023-07-05: 15 mL via OROMUCOSAL

## 2023-07-05 MED ORDER — CYCLOBENZAPRINE HCL 5 MG PO TABS
5.0000 mg | ORAL_TABLET | Freq: Three times a day (TID) | ORAL | Status: DC | PRN
  Administered 2023-07-05 (×2): 5 mg via ORAL
  Filled 2023-07-05: qty 1
  Filled 2023-07-05: qty 2

## 2023-07-05 MED ORDER — OXYCODONE HCL 5 MG PO TABS
5.0000 mg | ORAL_TABLET | ORAL | Status: DC | PRN
  Administered 2023-07-05: 10 mg via ORAL
  Administered 2023-07-06: 5 mg via ORAL
  Administered 2023-07-06: 10 mg via ORAL
  Filled 2023-07-05 (×5): qty 2

## 2023-07-05 MED ORDER — BUSPIRONE HCL 15 MG PO TABS
7.5000 mg | ORAL_TABLET | Freq: Every day | ORAL | Status: DC
  Administered 2023-07-05 – 2023-07-06 (×2): 7.5 mg via ORAL
  Filled 2023-07-05 (×2): qty 1

## 2023-07-05 MED ORDER — OXYCODONE HCL 5 MG PO TABS
10.0000 mg | ORAL_TABLET | ORAL | Status: DC | PRN
  Administered 2023-07-05 – 2023-07-06 (×3): 10 mg via ORAL
  Filled 2023-07-05: qty 2

## 2023-07-05 MED ORDER — GABAPENTIN 100 MG PO CAPS
100.0000 mg | ORAL_CAPSULE | Freq: Three times a day (TID) | ORAL | Status: DC
  Administered 2023-07-05 – 2023-07-06 (×3): 100 mg via ORAL
  Filled 2023-07-05 (×3): qty 1

## 2023-07-05 MED ORDER — ONDANSETRON HCL 4 MG/2ML IJ SOLN: 4.0000 mg | Freq: Four times a day (QID) | INTRAMUSCULAR | Status: DC | PRN

## 2023-07-05 MED ORDER — ACETAMINOPHEN 325 MG PO TABS: 325.0000 mg | ORAL_TABLET | Freq: Four times a day (QID) | ORAL | Status: DC | PRN

## 2023-07-05 MED ORDER — SODIUM CHLORIDE 0.9 % IV SOLN: INTRAVENOUS | Status: DC

## 2023-07-05 MED ORDER — BUPIVACAINE IN DEXTROSE 0.75-8.25 % IT SOLN
INTRATHECAL | Status: DC | PRN
  Administered 2023-07-05: 1.6 mL via INTRATHECAL

## 2023-07-05 MED ORDER — OXYCODONE HCL 5 MG PO TABS: 5.0000 mg | ORAL_TABLET | Freq: Once | ORAL | Status: DC | PRN

## 2023-07-05 MED ORDER — LIDOCAINE 2% (20 MG/ML) 5 ML SYRINGE
INTRAMUSCULAR | Status: AC
  Filled 2023-07-05: qty 5

## 2023-07-05 MED ORDER — CEFAZOLIN SODIUM-DEXTROSE 2-4 GM/100ML-% IV SOLN
2.0000 g | Freq: Four times a day (QID) | INTRAVENOUS | Status: AC
  Administered 2023-07-05 (×2): 2 g via INTRAVENOUS
  Filled 2023-07-05 (×2): qty 100

## 2023-07-05 MED ORDER — PANTOPRAZOLE SODIUM 40 MG PO TBEC
40.0000 mg | DELAYED_RELEASE_TABLET | Freq: Every day | ORAL | Status: DC
  Administered 2023-07-05 – 2023-07-06 (×2): 40 mg via ORAL
  Filled 2023-07-05 (×2): qty 1

## 2023-07-05 MED ORDER — EPHEDRINE 5 MG/ML INJ
INTRAVENOUS | Status: AC
  Filled 2023-07-05: qty 5

## 2023-07-05 MED ORDER — HYDROMORPHONE HCL 1 MG/ML IJ SOLN
INTRAMUSCULAR | Status: AC
  Filled 2023-07-05: qty 1

## 2023-07-05 MED ORDER — FENTANYL CITRATE (PF) 250 MCG/5ML IJ SOLN
INTRAMUSCULAR | Status: AC
Start: 2023-07-05 — End: 2023-07-05
  Filled 2023-07-05: qty 5

## 2023-07-05 MED ORDER — AMISULPRIDE (ANTIEMETIC) 5 MG/2ML IV SOLN: 10.0000 mg | Freq: Once | INTRAVENOUS | Status: DC | PRN

## 2023-07-05 MED ORDER — PROPOFOL 10 MG/ML IV BOLUS
INTRAVENOUS | Status: AC
  Filled 2023-07-05: qty 20

## 2023-07-05 MED ORDER — ACETAMINOPHEN 500 MG PO TABS
1000.0000 mg | ORAL_TABLET | Freq: Once | ORAL | Status: AC
  Administered 2023-07-05: 1000 mg via ORAL
  Filled 2023-07-05: qty 2

## 2023-07-05 MED ORDER — 0.9 % SODIUM CHLORIDE (POUR BTL) OPTIME
TOPICAL | Status: DC | PRN
  Administered 2023-07-05: 1000 mL

## 2023-07-05 MED ORDER — MIDAZOLAM HCL 2 MG/2ML IJ SOLN
INTRAMUSCULAR | Status: AC
  Filled 2023-07-05: qty 2

## 2023-07-05 MED ORDER — DOCUSATE SODIUM 100 MG PO CAPS
100.0000 mg | ORAL_CAPSULE | Freq: Two times a day (BID) | ORAL | Status: DC
  Administered 2023-07-05 – 2023-07-06 (×3): 100 mg via ORAL
  Filled 2023-07-05 (×3): qty 1

## 2023-07-05 MED ORDER — POVIDONE-IODINE 10 % EX SWAB
2.0000 | Freq: Once | CUTANEOUS | Status: AC
  Administered 2023-07-05: 2 via TOPICAL

## 2023-07-05 MED ORDER — HYDROMORPHONE HCL 1 MG/ML IJ SOLN
0.5000 mg | INTRAMUSCULAR | Status: DC | PRN
  Administered 2023-07-05: 1 mg via INTRAVENOUS

## 2023-07-05 MED ORDER — METOCLOPRAMIDE HCL 5 MG/ML IJ SOLN: 5.0000 mg | Freq: Three times a day (TID) | INTRAMUSCULAR | Status: DC | PRN

## 2023-07-05 MED ORDER — SERTRALINE HCL 50 MG PO TABS
50.0000 mg | ORAL_TABLET | Freq: Two times a day (BID) | ORAL | Status: DC
  Administered 2023-07-05 – 2023-07-06 (×3): 50 mg via ORAL
  Filled 2023-07-05 (×3): qty 1

## 2023-07-05 MED ORDER — SODIUM CHLORIDE 0.9 % IR SOLN
Status: DC | PRN
  Administered 2023-07-05: 1000 mL

## 2023-07-05 MED ORDER — ORAL CARE MOUTH RINSE: 15.0000 mL | Freq: Once | OROMUCOSAL | Status: AC

## 2023-07-05 MED ORDER — FENTANYL CITRATE (PF) 100 MCG/2ML IJ SOLN
INTRAMUSCULAR | Status: AC
Start: 2023-07-05 — End: 2023-07-05
  Filled 2023-07-05: qty 2

## 2023-07-05 MED ORDER — PHENYLEPHRINE HCL-NACL 20-0.9 MG/250ML-% IV SOLN
INTRAVENOUS | Status: DC | PRN
  Administered 2023-07-05: 30 ug/min via INTRAVENOUS

## 2023-07-05 MED ORDER — ONDANSETRON HCL 4 MG/2ML IJ SOLN
INTRAMUSCULAR | Status: DC | PRN
  Administered 2023-07-05: 4 mg via INTRAVENOUS

## 2023-07-05 SURGICAL SUPPLY — 44 items
BAG COUNTER SPONGE SURGICOUNT (BAG) ×2 IMPLANT
BENZOIN TINCTURE PRP APPL 2/3 (GAUZE/BANDAGES/DRESSINGS) ×2 IMPLANT
BLADE CLIPPER SURG (BLADE) IMPLANT
BLADE SAW SGTL 18X1.27X75 (BLADE) ×2 IMPLANT
COVER SURGICAL LIGHT HANDLE (MISCELLANEOUS) ×2 IMPLANT
DRAPE C-ARM 42X72 X-RAY (DRAPES) ×2 IMPLANT
DRAPE STERI IOBAN 125X83 (DRAPES) ×2 IMPLANT
DRAPE U-SHAPE 47X51 STRL (DRAPES) ×6 IMPLANT
DRSG AQUACEL AG ADV 3.5X10 (GAUZE/BANDAGES/DRESSINGS) ×2 IMPLANT
DURAPREP 26ML APPLICATOR (WOUND CARE) ×2 IMPLANT
ELECT BLADE 6.5 EXT (BLADE) IMPLANT
ELECTRODE BLDE 4.0 EZ CLN MEGD (MISCELLANEOUS) ×2 IMPLANT
ELECTRODE REM PT RTRN 9FT ADLT (ELECTROSURGICAL) ×2 IMPLANT
FACESHIELD WRAPAROUND (MASK) ×2 IMPLANT
FACESHIELD WRAPAROUND OR TEAM (MASK) ×4 IMPLANT
GLOVE BIOGEL PI IND STRL 8 (GLOVE) ×4 IMPLANT
GLOVE ECLIPSE 8.0 STRL XLNG CF (GLOVE) ×2 IMPLANT
GLOVE ORTHO TXT STRL SZ7.5 (GLOVE) ×4 IMPLANT
GOWN STRL REUS W/ TWL LRG LVL3 (GOWN DISPOSABLE) ×4 IMPLANT
GOWN STRL REUS W/ TWL XL LVL3 (GOWN DISPOSABLE) ×4 IMPLANT
HEAD CERAMIC 36 PLUS5 (Hips) IMPLANT
KIT BASIN OR (CUSTOM PROCEDURE TRAY) ×2 IMPLANT
KIT TURNOVER KIT B (KITS) ×2 IMPLANT
LINER NEUTRAL 52X36MM PLUS 4 (Liner) IMPLANT
MANIFOLD NEPTUNE II (INSTRUMENTS) ×2 IMPLANT
NS IRRIG 1000ML POUR BTL (IV SOLUTION) ×2 IMPLANT
PACK TOTAL JOINT (CUSTOM PROCEDURE TRAY) ×2 IMPLANT
PAD ARMBOARD POSITIONER FOAM (MISCELLANEOUS) ×2 IMPLANT
PIN SECTOR W/GRIP ACE CUP 52MM (Hips) IMPLANT
SET HNDPC FAN SPRY TIP SCT (DISPOSABLE) ×2 IMPLANT
STAPLER SKIN PROX 35W (STAPLE) IMPLANT
STEM FEM ACTIS STD SZ4 (Stem) IMPLANT
STRIP CLOSURE SKIN 1/2X4 (GAUZE/BANDAGES/DRESSINGS) ×4 IMPLANT
SUT ETHIBOND NAB CT1 #1 30IN (SUTURE) ×2 IMPLANT
SUT MNCRL AB 3-0 PS2 27 (SUTURE) IMPLANT
SUT MNCRL AB 4-0 PS2 18 (SUTURE) IMPLANT
SUT VIC AB 0 CT1 27XBRD ANBCTR (SUTURE) ×2 IMPLANT
SUT VIC AB 1 CT1 27XBRD ANBCTR (SUTURE) ×2 IMPLANT
SUT VIC AB 2-0 CT1 TAPERPNT 27 (SUTURE) ×2 IMPLANT
TOWEL GREEN STERILE (TOWEL DISPOSABLE) ×2 IMPLANT
TOWEL GREEN STERILE FF (TOWEL DISPOSABLE) ×2 IMPLANT
TRAY CATH INTERMITTENT SS 16FR (CATHETERS) IMPLANT
TRAY FOLEY W/BAG SLVR 16FR ST (SET/KITS/TRAYS/PACK) IMPLANT
WATER STERILE IRR 1000ML POUR (IV SOLUTION) ×4 IMPLANT

## 2023-07-05 NOTE — Evaluation (Signed)
 Physical Therapy Evaluation Patient Details Name: Erika Burns MRN: [id number] DOB: 02/13/1965 Today's Date: 07/05/2023  History of Present Illness  Patient is a 58 y/o female admitted for R direct anterior THA.  PMH positive for GAD, bowl obstruction s/p surgery and cervicalgia.  Clinical Impression  Patient presents with decreased mobility due to pain and limited ROM R hip and decreased activity tolerance with generalized weakness and fatigue.  Previously independent at home, currently CGA to close S for in room ambulation to bathroom with RW.  Patient will benefit from skilled PT in the acute setting prior to d/c home with follow up as planned per MD.        If plan is discharge home, recommend the following: A little help with walking and/or transfers;Assist for transportation;Help with stairs or ramp for entrance   Can travel by private vehicle        Equipment Recommendations Rolling walker (2 wheels)  Recommendations for Other Services       Functional Status Assessment Patient has had a recent decline in their functional status and demonstrates the ability to make significant improvements in function in a reasonable and predictable amount of time.     Precautions / Restrictions Precautions Precautions: Fall Recall of Precautions/Restrictions: Intact      Mobility  Bed Mobility Overal bed mobility: Needs Assistance Bed Mobility: Supine to Sit     Supine to sit: Min assist     General bed mobility comments: for R LE    Transfers Overall transfer level: Needs assistance Equipment used: Rolling walker (2 wheels) Transfers: Sit to/from Stand Sit to Stand: Supervision, Contact guard assist           General transfer comment: from EOB with CGA from toilet in bathroom with grabbar with S.    Ambulation/Gait Ambulation/Gait assistance: Supervision, Contact guard assist Gait Distance (Feet): 14 Feet (x2) Assistive device: Rolling walker (2 wheels) Gait  Pattern/deviations: Step-to pattern, Step-through pattern, Decreased stride length, Trunk flexed, Wide base of support       General Gait Details: tending to put walker too far forward so cues for keeping walker close and staying inside walker, cues for sequencing; to bathroom then to recliner due to light headed  Stairs            Wheelchair Mobility     Tilt Bed    Modified Rankin (Stroke Patients Only)       Balance Overall balance assessment: Needs assistance   Sitting balance-Leahy Scale: Good     Standing balance support: Single extremity supported, Bilateral upper extremity supported Standing balance-Leahy Scale: Poor                               Pertinent Vitals/Pain Pain Assessment Pain Assessment: Faces Faces Pain Scale: Hurts little more Pain Location: R hip with mobility Pain Descriptors / Indicators: Guarding, Grimacing, Operative site guarding Pain Intervention(s): Monitored during session, Premedicated before session, Repositioned, Ice applied    Home Living Family/patient expects to be discharged to:: Private residence Living Arrangements: Spouse/significant other Available Help at Discharge: Family Type of Home: House Home Access: Stairs to enter   Secretary/administrator of Steps: 1 Alternate Level Stairs-Number of Steps: 15 Home Layout: Two level;Bed/bath upstairs Home Equipment: None      Prior Function Prior Level of Function : Independent/Modified Independent             Mobility Comments: reports did everything  just limped       Extremity/Trunk Assessment   Upper Extremity Assessment Upper Extremity Assessment: Overall WFL for tasks assessed    Lower Extremity Assessment Lower Extremity Assessment: RLE deficits/detail RLE Deficits / Details: able to lift leg though painful, knee extension strength 4-/5, AROM limited hip flexion in supine about 50 degrees       Communication    Communication Communication: No apparent difficulties    Cognition Arousal: Alert Behavior During Therapy: WFL for tasks assessed/performed   PT - Cognitive impairments: No apparent impairments                         Following commands: Intact       Cueing Cueing Techniques: Verbal cues     General Comments General comments (skin integrity, edema, etc.): spouse present and supportive, BP after ambulation when in chair 91/58; attempted to urinate in bathroom but not able, RN Aware    Exercises Total Joint Exercises Ankle Circles/Pumps: AROM, 5 reps, Both, Supine Quad Sets: AROM, 5 reps, Both, Supine Heel Slides: AROM, AAROM, 5 reps, Both, Supine Hip ABduction/ADduction: AROM, AAROM, 5 reps, Right, Supine   Assessment/Plan    PT Assessment Patient needs continued PT services  PT Problem List Decreased strength;Decreased balance;Pain;Decreased range of motion;Decreased mobility;Decreased activity tolerance       PT Treatment Interventions DME instruction;Functional mobility training;Balance training;Patient/family education;Therapeutic activities;Gait training;Stair training;Therapeutic exercise    PT Goals (Current goals can be found in the Care Plan section)  Acute Rehab PT Goals Patient Stated Goal: return to independent PT Goal Formulation: With patient/family Time For Goal Achievement: 07/11/23 Potential to Achieve Goals: Good    Frequency 7X/week     Co-evaluation               AM-PAC PT 6 Clicks Mobility  Outcome Measure Help needed turning from your back to your side while in a flat bed without using bedrails?: A Little Help needed moving from lying on your back to sitting on the side of a flat bed without using bedrails?: A Little Help needed moving to and from a bed to a chair (including a wheelchair)?: A Little Help needed standing up from a chair using your arms (e.g., wheelchair or bedside chair)?: A Little Help needed to walk in  hospital room?: A Little Help needed climbing 3-5 steps with a railing? : Total 6 Click Score: 16    End of Session Equipment Utilized During Treatment: Gait belt Activity Tolerance: Patient limited by fatigue Patient left: in chair;with call bell/phone within reach;with family/visitor present Nurse Communication: Mobility status PT Visit Diagnosis: Difficulty in walking, not elsewhere classified (R26.2)    Time: 1430-1500 PT Time Calculation (min) (ACUTE ONLY): 30 min   Charges:   PT Evaluation $PT Eval Low Complexity: 1 Low PT Treatments $Gait Training: 8-22 mins PT General Charges $$ ACUTE PT VISIT: 1 Visit         Abigail Hoff, PT Acute Rehabilitation Services [phone] 07/05/2023   Marley Simmers 07/05/2023, 5:58 PM

## 2023-07-05 NOTE — Transfer of Care (Signed)
 Immediate Anesthesia Transfer of Care Note  Patient: Erika Burns  Procedure(s) Performed: ARTHROPLASTY, HIP, TOTAL, ANTERIOR APPROACH (Right: Hip)  Patient Location: PACU  Anesthesia Type:MAC and Spinal  Level of Consciousness: awake, alert , oriented, and patient cooperative  Airway & Oxygen Therapy: Patient Spontanous Breathing and Patient connected to face mask oxygen  Post-op Assessment: Report given to RN and Post -op Vital signs reviewed and stable  Post vital signs: Reviewed and stable  Last Vitals:  Vitals Value Taken Time  BP 126/77 07/05/23 09:22  Temp    Pulse 57 07/05/23 09:25  Resp 8 07/05/23 09:25  SpO2 99 % 07/05/23 09:25  Vitals shown include unfiled device data.  Last Pain:  Vitals:   07/05/23 0606  PainSc: 0-No pain         Complications: No notable events documented.

## 2023-07-05 NOTE — Op Note (Signed)
 Operative Note  Date of operation: 07/05/2023 Preoperative diagnosis: Right hip primary osteoarthritis Postoperative diagnosis: Same  Procedure: Right direct anterior total hip arthroplasty  Implants: DePuy Sectra GRIPTION acetabular opponent size 52, 36+4 polythene liner, size 4 Actis femoral component with 100 offset, 36+5 ceramic head ball  Surgeon: Jeanella Milan. Lucienne Ryder, MD Assistant: Malena Scull, PA-C  Anesthesia: Spinal EBL: 200 cc Antibiotics: IV Ancef Complications: None  Indications: The patient is a 58 year old active female with debilitating arthritis involving her right hip that is seen on clinical exam and x-ray findings.  It is bone-on-bone wear at this point.  She has tried and failed conservative treatment for over a year now.  Her right hip pain is daily and it is detrimentally affecting her mobility, her quality of life and her actives daily living to the point she does wish to proceed with a total hip arthroplasty and we agree with this as well.  We did discuss in length in detail the risks of acute blood loss anemia, nerve vessel injury, fracture, infection, DVT, implant failure, dislocation, leg length differences and wound healing issues.  Procedure description: After informed consent was obtained and the appropriate right hip was marked, the patient was brought to the operating room and set up on the stretcher where spinal anesthesia was obtained.  She was then laid in supine position on stretcher and traction boots were placed on both her feet.  We did assess her leg lengths and she is significantly shorter on the right side than the left from her significant hip arthritis.  She was next placed supine on the Hana fracture table with a perineal post in place and both legs in inline skeletal traction devices but no traction applied.  Her right operative hip and pelvis were assessed radiographically.  The right hip was prepped and draped with DuraPrep and sterile drapes.   A timeout was called and she was identified as correct patient and the correct right hip.  An incision was then made just inferior and posterior to the ASIS and carried slightly obliquely down the leg.  Dissection was carried down to the tensor fascia lata muscle and the tensor fascia was divided longitudinally to proceed with a direct interposed the hip.  Circumflex vessels were identified and cauterized.  The hip capsule identified in L-type format finding a large joint effusion consistent with her severe arthritis.  Cobra retractors were placed around the medial and lateral femoral neck and a femoral neck cut was made with an oscillating saw just proximal to the lesser trochanter and this cut was completed with an osteotome.  A corkscrew guide was placed in the femoral head and the femoral head was removed in its entirety and there was a wide area devoid of cartilage.  A bent Hohmann was then placed over the medial acetabular rim and remnants of the acetabular labrum and other debris removed.  Reaming was then initiated from a size 43 reamer and stepwise increments going up to a size 51 reamer with all reamers placed under direct visualization and the last reamer placed under direct fluoroscopy in order to obtain the depth reaming, the inclination and the anteversion.  The real DePuy Sectra GRIPTION acetabular component size 52 was then placed without difficulty followed by a 36+4 polyethylene liner.  Attention was then turned to the femur.  The right leg was externally rotated to 120 degrees, extended and adducted.  Mueller retractor was placed by the medial calcar and a long bent Hohmann behind the greater  trochanter.  The lateral joint capsule was released and a box cutting osteotome was used into the femoral canal.  Broaching was then initiated using the Actis broaching system from a size 0 going to a size 4.  With a size 4 in place we trialed a standard offset femoral neck and a 36+1.5 trial hip ball.  The  right leg was brought over and up and with traction and internal rotation was reduced in the pelvis.  Based on radiographic and clinical assessment we needed more leg length.  We the offset was acceptable.  We then dislocated the hip and remove the trial components.  We placed the real Actis femoral component with standard offset size 4 and with a 36+5 ceramic head ball to give her more length.  This was reduced in the pelvis and very pleased with leg length, range of motion, offset and stability assessed radiographically and clinically.  The soft tissue was then irrigated with normal saline solution.  Remnants of the joint capsule were closed with interrupted #1 Ethibond suture followed by normal Vicryl to close the tensor fascia.  0 Vicryl was used to close deep tissue and 2-0 Vicryl was used to close subcutaneous tissue.  A subcuticular Monocryl suture was placed followed by Steri-Strips on the skin.  An Aquacel dressing was applied.  A Foley catheter was then placed and the patient was taken the recovery room in stable condition.  Malena Scull, PA-C did assist during the entire case from beginning end and his assistance was crucial and medically necessary for soft tissue management and retraction, helping guide implant placement and a layered closure of the wound.

## 2023-07-05 NOTE — Anesthesia Procedure Notes (Signed)
 Spinal  Patient location during procedure: OR Start time: 07/05/2023 7:43 AM End time: 07/05/2023 7:45 AM Reason for block: surgical anesthesia Staffing Performed: anesthesiologist  Anesthesiologist: Grace Laura, MD Performed by: Grace Laura, MD Authorized by: Grace Laura, MD   Preanesthetic Checklist Completed: patient identified, IV checked, risks and benefits discussed, surgical consent, monitors and equipment checked, pre-op evaluation and timeout performed Spinal Block Patient position: sitting Prep: DuraPrep and site prepped and draped Patient monitoring: cardiac monitor, continuous pulse ox and blood pressure Approach: midline Location: L3-4 Injection technique: single-shot Needle Needle type: Pencan  Needle gauge: 24 G Needle length: 9 cm Assessment Sensory level: T6 Events: CSF return Additional Notes Functioning IV was confirmed and monitors were applied. Sterile prep and drape, including hand hygiene and sterile gloves were used. The patient was positioned and the spine was prepped. The skin was anesthetized with lidocaine.  Free flow of clear CSF was obtained prior to injecting local anesthetic into the CSF.  The spinal needle aspirated freely following injection.  The needle was carefully withdrawn.  The patient tolerated the procedure well.

## 2023-07-05 NOTE — Interval H&P Note (Signed)
 History and Physical Interval Note: The patient understands that she is here today for a right total hip replacement to treat her significant right hip pain and arthritis.  There has been no acute or interval change in her medical status.  The risks and benefits of surgery have been discussed in detail and informed consent has been obtained.  The right operative hip has been marked.  07/05/2023 7:20 AM  Erika Burns  has presented today for surgery, with the diagnosis of Osteoarthritis right hip.  The various methods of treatment have been discussed with the patient and family. After consideration of risks, benefits and other options for treatment, the patient has consented to  Procedure(s): ARTHROPLASTY, HIP, TOTAL, ANTERIOR APPROACH (Right) as a surgical intervention.  The patient's history has been reviewed, patient examined, no change in status, stable for surgery.  I have reviewed the patient's chart and labs.  Questions were answered to the patient's satisfaction.     Arnie Lao

## 2023-07-06 ENCOUNTER — Other Ambulatory Visit (HOSPITAL_COMMUNITY): Payer: Self-pay

## 2023-07-06 ENCOUNTER — Encounter (HOSPITAL_COMMUNITY): Payer: Self-pay | Admitting: Orthopaedic Surgery

## 2023-07-06 DIAGNOSIS — M1611 Unilateral primary osteoarthritis, right hip: Secondary | ICD-10-CM | POA: Diagnosis not present

## 2023-07-06 MED ORDER — ASPIRIN 81 MG PO CHEW
81.0000 mg | CHEWABLE_TABLET | Freq: Two times a day (BID) | ORAL | Status: DC
  Administered 2023-07-06: 81 mg via ORAL
  Filled 2023-07-06: qty 1

## 2023-07-06 MED ORDER — ASPIRIN 81 MG PO CHEW
81.0000 mg | CHEWABLE_TABLET | Freq: Two times a day (BID) | ORAL | 0 refills | Status: DC
  Filled 2023-07-06: qty 30, 15d supply, fill #0

## 2023-07-06 MED ORDER — OXYCODONE HCL 5 MG PO TABS
5.0000 mg | ORAL_TABLET | Freq: Four times a day (QID) | ORAL | 0 refills | Status: DC | PRN
  Filled 2023-07-06: qty 30, 4d supply, fill #0

## 2023-07-06 MED ORDER — CYCLOBENZAPRINE HCL 5 MG PO TABS
5.0000 mg | ORAL_TABLET | Freq: Three times a day (TID) | ORAL | 2 refills | Status: DC | PRN
  Filled 2023-07-06: qty 30, 5d supply, fill #0

## 2023-07-06 NOTE — Progress Notes (Signed)
 Subjective: 1 Day Post-Op Procedure(s) (LRB): ARTHROPLASTY, HIP, TOTAL, ANTERIOR APPROACH (Right) Patient reports pain as moderate.    Objective: Vital signs in last 24 hours: Temp:  [95.3 F (35.2 C)-99.5 F (37.5 C)] 98.5 F (36.9 C) (06/18 0353) Pulse Rate:  [54-76] 76 (06/18 0353) Resp:  [11-20] 18 (06/18 0353) BP: (98-135)/(58-80) 105/58 (06/18 0353) SpO2:  [92 %-100 %] 92 % (06/18 0353)  Intake/Output from previous day: 06/17 0701 - 06/18 0700 In: 2380 [P.O.:1680; I.V.:500; IV Piggyback:200] Out: 900 [Urine:700; Blood:200] Intake/Output this shift: No intake/output data recorded.  No results for input(s): HGB in the last 72 hours. No results for input(s): WBC, RBC, HCT, PLT in the last 72 hours. No results for input(s): NA, K, CL, CO2, BUN, CREATININE, GLUCOSE, CALCIUM in the last 72 hours. No results for input(s): LABPT, INR in the last 72 hours.  Sensation intact distally Intact pulses distally Dorsiflexion/Plantar flexion intact Incision: dressing C/D/I   Assessment/Plan: 1 Day Post-Op Procedure(s) (LRB): ARTHROPLASTY, HIP, TOTAL, ANTERIOR APPROACH (Right) Up with therapy Discharge home with home health      Arnie Lao 07/06/2023, 7:45 AM

## 2023-07-06 NOTE — Anesthesia Postprocedure Evaluation (Signed)
 Anesthesia Post Note  Patient: Erika Burns  Procedure(s) Performed: ARTHROPLASTY, HIP, TOTAL, ANTERIOR APPROACH (Right: Hip)     Patient location during evaluation: PACU Anesthesia Type: Spinal Level of consciousness: oriented and awake and alert Pain management: pain level controlled Vital Signs Assessment: post-procedure vital signs reviewed and stable Respiratory status: spontaneous breathing, respiratory function stable and patient connected to nasal cannula oxygen Cardiovascular status: blood pressure returned to baseline and stable Postop Assessment: no headache, no backache and no apparent nausea or vomiting Anesthetic complications: no  No notable events documented.  Last Vitals:  Vitals:   07/05/23 2244 07/06/23 0353  BP: 119/70 (!) 105/58  Pulse: 75 76  Resp: 18 18  Temp: 37.5 C 36.9 C  SpO2: 93% 92%    Last Pain:  Vitals:   07/06/23 1210  TempSrc:   PainSc: 7    Pain Goal: Patients Stated Pain Goal: 3 (07/06/23 1210)                 Cire Deyarmin L Viktorya Arguijo

## 2023-07-06 NOTE — Discharge Instructions (Signed)

## 2023-07-06 NOTE — Discharge Summary (Signed)
 Patient ID: Erika Burns MRN: [id number] DOB/AGE: 1965/12/02 58 y.o.  Admit date: 07/05/2023 Discharge date: 07/06/2023  Admission Diagnoses:  Principal Problem:   Unilateral primary osteoarthritis, right hip Active Problems:   Status post total replacement of right hip   Discharge Diagnoses:  Same  Past Medical History:  Diagnosis Date   Abdominal pain in female patient 04/14/2012   Bowel obstruction (HCC)    Osteoporosis     Surgeries: Procedure(s): ARTHROPLASTY, HIP, TOTAL, ANTERIOR APPROACH on 07/05/2023   Consultants:   Discharged Condition: Improved  Hospital Course: Erika Burns is an 58 y.o. female who was admitted 07/05/2023 for operative treatment ofUnilateral primary osteoarthritis, right hip. Patient has severe unremitting pain that affects sleep, daily activities, and work/hobbies. After pre-op clearance the patient was taken to the operating room on 07/05/2023 and underwent  Procedure(s): ARTHROPLASTY, HIP, TOTAL, ANTERIOR APPROACH.    Patient was given perioperative antibiotics:  Anti-infectives (From admission, onward)    Start     Dose/Rate Route Frequency Ordered Stop   07/05/23 1245  ceFAZolin (ANCEF) IVPB 2g/100 mL premix        2 g 200 mL/hr over 30 Minutes Intravenous Every 6 hours 07/05/23 1157 07/05/23 1729   07/05/23 0600  ceFAZolin (ANCEF) IVPB 2g/100 mL premix        2 g 200 mL/hr over 30 Minutes Intravenous On call to O.R. 07/05/23 7829 07/05/23 0750        Patient was given sequential compression devices, early ambulation, and chemoprophylaxis to prevent DVT.  Patient benefited maximally from hospital stay and there were no complications.    Recent vital signs: Patient Vitals for the past 24 hrs:  BP Temp Temp src Pulse Resp SpO2  07/06/23 0353 (!) 105/58 98.5 F (36.9 C) Oral 76 18 92 %  07/05/23 2244 119/70 99.5 F (37.5 C) Oral 75 18 93 %  07/05/23 1932 (!) 98/58 99.3 F (37.4 C) Oral 66 18 96 %  07/05/23 1709 121/80 98 F  (36.7 C) -- 63 20 100 %     Recent laboratory studies: No results for input(s): WBC, HGB, HCT, PLT, NA, K, CL, CO2, BUN, CREATININE, GLUCOSE, INR, CALCIUM in the last 72 hours.  Invalid input(s): PT, 2   Discharge Medications:   Allergies as of 07/06/2023   No Known Allergies      Medication List     TAKE these medications    Aspirin Low Dose 81 MG chewable tablet Generic drug: aspirin Chew 1 tablet (81 mg total) by mouth 2 (two) times daily.   busPIRone  7.5 MG tablet Commonly known as: BUSPAR  Take 1 tablet (7.5 mg total) by mouth 2 (two) times daily. What changed: when to take this   clonazePAM  0.25 MG disintegrating tablet Commonly known as: KLONOPIN  Take 1-2 tablets (0.25-0.5 mg total) by mouth daily as needed (Severe anxiety).   cyclobenzaprine  5 MG tablet Commonly known as: FLEXERIL  Take 1-2 tablets (5-10 mg total) by mouth 3 (three) times daily as needed for muscle spasms.   estradiol 0.05 MG/24HR patch Commonly known as: VIVELLE-DOT Place 1 patch onto the skin 2 (two) times a week.   ibandronate  150 MG tablet Commonly known as: BONIVA  Take 1 tablet (150 mg total) by mouth every 30 (thirty) days.   MAGNESIUM OXIDE PO Take 1 tablet by mouth daily.   omeprazole 20 MG tablet Commonly known as: PRILOSEC OTC Take 20 mg by mouth daily.   oxyCODONE 5 MG immediate release tablet Commonly known as:  Oxy IR/ROXICODONE Take 1-2 tablets (5-10 mg total) by mouth every 6 (six) hours as needed for moderate pain (pain score 4-6) (pain score 4-6).   progesterone 100 MG capsule Commonly known as: PROMETRIUM Take 100 mg by mouth daily.   sertraline  50 MG tablet Commonly known as: ZOLOFT  Take 1 tablet (50 mg total) by mouth in the morning and at bedtime.   Vitamin D3 10 MCG (400 UNIT) tablet Take 800 Units by mouth daily.               Durable Medical Equipment  (From admission, onward)           Start     Ordered    07/06/23 1256  For home use only DME Walker rolling  Once       Question Answer Comment  Walker: With 5 Inch Wheels   Patient needs a walker to treat with the following condition Status post hip surgery      07/06/23 1256   07/05/23 1158  DME 3 n 1  Once        07/05/23 1157   07/05/23 1158  DME Walker rolling  Once       Question Answer Comment  Walker: With 5 Inch Wheels   Patient needs a walker to treat with the following condition Status post total replacement of right hip      07/05/23 1157            Diagnostic Studies: DG Pelvis Portable Result Date: 07/05/2023 CLINICAL DATA:  Status post right hip arthroplasty. EXAM: PORTABLE PELVIS 1-2 VIEWS COMPARISON:  Pelvis and right hip radiographs 05/19/2023 FINDINGS: Interval total right hip arthroplasty. No perihardware lucency is seen to indicate hardware failure or loosening. Expected mild lateral right thigh and hip subcutaneous and intra-articular air consistent with recent surgery. The bilateral sacroiliac, left femoroacetabular, and pubic symphysis joint spaces are maintained. No acute fracture dislocation. Vascular phleboliths overlie the pelvis. IMPRESSION: Interval total right hip arthroplasty without evidence of hardware failure. Electronically Signed   By: Bertina Broccoli M.D.   On: 07/05/2023 12:42   DG HIP UNILAT WITH PELVIS 1V RIGHT Result Date: 07/05/2023 CLINICAL DATA:  Anterior approach right hip arthroplasty. Intraoperative fluoroscopy. EXAM: DG HIP (WITH OR WITHOUT PELVIS) 1V RIGHT COMPARISON:  Pelvis and right hip radiographs 05/19/2023 FINDINGS: Images were performed intraoperatively without the presence of a radiologist. Interval total right hip arthroplasty. No hardware complication is seen. Total fluoroscopy images: 3 Total fluoroscopy time: 14 seconds Total dose: Radiation Exposure Index (as provided by the fluoroscopic device): 1.26 mGy air Kerma Please see intraoperative findings for further detail. IMPRESSION:  Intraoperative fluoroscopy for total right hip arthroplasty. Electronically Signed   By: Bertina Broccoli M.D.   On: 07/05/2023 12:41   DG C-Arm 1-60 Min-No Report Result Date: 07/05/2023 Fluoroscopy was utilized by the requesting physician.  No radiographic interpretation.    Disposition: Discharge disposition: 01-Home or Self Care          Follow-up Information     [facility] Follow up.   Why: [facility] will contact you for the first home visit. Contact information: [phone]        Arnie Lao, MD Follow up in 2 week(s).   Specialty: Orthopedic Surgery Contact information: [address] [place] [address] [phone]                  Signed: Arnie Lao 07/06/2023, 1:18 PM

## 2023-07-06 NOTE — Progress Notes (Signed)
 Physical Therapy Treatment  Patient Details Name: Erika Burns MRN: [id number] DOB: 1965-06-05 Today's Date: 07/06/2023   History of Present Illness Patient is a 58 y/o female admitted for R direct anterior THA.  PMH positive for GAD, bowl obstruction s/p surgery and cervicalgia.    PT Comments  Pt progressing with post-op mobility. Overall pt moving very slowly but with gross CGA and RW for support. Pt continues to require min assist for bed mobility. Reviewed HEP, stair negotiation, car transfer, and activity progression. Will continue to follow and progress as able per POC.     If plan is discharge home, recommend the following: A little help with walking and/or transfers;Assist for transportation;Help with stairs or ramp for entrance   Can travel by private vehicle        Equipment Recommendations  Rolling walker (2 wheels)    Recommendations for Other Services       Precautions / Restrictions Precautions Precautions: Fall Recall of Precautions/Restrictions: Intact Restrictions Weight Bearing Restrictions Per Provider Order: Yes RLE Weight Bearing Per Provider Order: Weight bearing as tolerated     Mobility  Bed Mobility Overal bed mobility: Needs Assistance Bed Mobility: Supine to Sit, Sit to Supine     Supine to sit: Min assist Sit to supine: Min assist   General bed mobility comments: Assist required for RLE management. Pt with difficulty repositioning in bed and all bed mobility appeared very effortful.    Transfers Overall transfer level: Needs assistance Equipment used: Rolling walker (2 wheels) Transfers: Sit to/from Stand Sit to Stand: Supervision, Contact guard assist           General transfer comment: VC's for hand placement on seated surface for safety.    Ambulation/Gait Ambulation/Gait assistance: Supervision, Contact guard assist Gait Distance (Feet): 50 Feet Assistive device: Rolling walker (2 wheels) Gait Pattern/deviations: Step-to  pattern, Step-through pattern, Decreased stride length, Trunk flexed, Wide base of support Gait velocity: Decreased Gait velocity interpretation: <1.31 ft/sec, indicative of household ambulator   General Gait Details: VC's for sequencing with the RW and increased heel strike with the RLE. Pt fatiguing quickly and moving very slow.   Stairs Stairs: Yes Stairs assistance: Contact guard assist Stair Management: Two rails, Step to pattern, Forwards Number of Stairs: 8 General stair comments: VC's for sequencing and general safety. Pt was able to manage stairs well however very slow with advancing to the next step.   Wheelchair Mobility     Tilt Bed    Modified Rankin (Stroke Patients Only)       Balance Overall balance assessment: Needs assistance Sitting-balance support: No upper extremity supported Sitting balance-Leahy Scale: Good     Standing balance support: Single extremity supported, Bilateral upper extremity supported, Reliant on assistive device for balance Standing balance-Leahy Scale: Poor                              Communication Communication Communication: No apparent difficulties  Cognition Arousal: Alert Behavior During Therapy: WFL for tasks assessed/performed   PT - Cognitive impairments: No apparent impairments                         Following commands: Intact      Cueing Cueing Techniques: Verbal cues  Exercises Total Joint Exercises Ankle Circles/Pumps: 10 reps Quad Sets: 10 reps Short Arc Quad: 15 reps Heel Slides: 10 reps Hip ABduction/ADduction: 10 reps Long Arc Quad:  10 reps    General Comments        Pertinent Vitals/Pain Pain Assessment Pain Assessment: Faces Faces Pain Scale: Hurts little more Pain Location: R hip with mobility Pain Descriptors / Indicators: Guarding, Grimacing, Operative site guarding Pain Intervention(s): Limited activity within patient's tolerance, Monitored during session,  Repositioned    Home Living                          Prior Function            PT Goals (current goals can now be found in the care plan section) Acute Rehab PT Goals Patient Stated Goal: return to independent PT Goal Formulation: With patient/family Time For Goal Achievement: 07/11/23 Potential to Achieve Goals: Good Progress towards PT goals: Progressing toward goals    Frequency    7X/week      PT Plan      Co-evaluation              AM-PAC PT 6 Clicks Mobility   Outcome Measure  Help needed turning from your back to your side while in a flat bed without using bedrails?: A Little Help needed moving from lying on your back to sitting on the side of a flat bed without using bedrails?: A Little Help needed moving to and from a bed to a chair (including a wheelchair)?: A Little Help needed standing up from a chair using your arms (e.g., wheelchair or bedside chair)?: A Little Help needed to walk in hospital room?: A Little Help needed climbing 3-5 steps with a railing? : A Little 6 Click Score: 18    End of Session Equipment Utilized During Treatment: Gait belt Activity Tolerance: Patient limited by fatigue Patient left: in chair;with call bell/phone within reach;with family/visitor present Nurse Communication: Mobility status PT Visit Diagnosis: Difficulty in walking, not elsewhere classified (R26.2)     Time: 4401-0272 PT Time Calculation (min) (ACUTE ONLY): 54 min  Charges:    $Gait Training: 23-37 mins $Therapeutic Exercise: 23-37 mins PT General Charges $$ ACUTE PT VISIT: 1 Visit                     Simone Dubois, PT, DPT Acute Rehabilitation Services Secure Chat Preferred Office: [phone]    Erika Burns 07/06/2023, 1:04 PM

## 2023-07-06 NOTE — Care Plan (Signed)
 Made aware by [facility] that insurance coverage for this patient wasn't guaranteed, so recommended either OPPT or no HHPT. Discussed with Dr. Lucienne Ryder and in agreement that patient is doing well, so no need for HHPT at this time. Spoke with patient and she verbalized understanding that she will go home without HHPT. Discussed ambulating frequently. Once home, if patient feels she needs additional therapy, she will call and we will attempt to get her into OPPT. 3C staff made aware.

## 2023-07-06 NOTE — Plan of Care (Signed)
Pt doing well. Pt and husband given D/C instructions with verbal understanding. Rx's were sent to the pharmacy by MD. Pt's incision is clean and dry with no sign of infection. Pt's IV was removed prior to D/C. Pt received RW from [employer] per MD order. Pt D/C'd home via wheelchair per MD order. Pt is stable @ D/C and has no other needs at this time. Holli Humbles, RN

## 2023-07-18 ENCOUNTER — Encounter: Payer: Self-pay | Admitting: Orthopaedic Surgery

## 2023-07-18 ENCOUNTER — Ambulatory Visit (INDEPENDENT_AMBULATORY_CARE_PROVIDER_SITE_OTHER): Admitting: Orthopaedic Surgery

## 2023-07-18 DIAGNOSIS — Z96641 Presence of right artificial hip joint: Secondary | ICD-10-CM

## 2023-07-18 NOTE — Progress Notes (Signed)
 The patient is a 58 year old who is now 2 weeks status post a right total hip arthroplasty to treat severe right hip arthritis.  She is ambulate with a walker but doing well.  She is not taking pain medication.  She been compliant with a baby aspirin  twice daily.  Her right hip incision looks good.  Steri-Strips have been removed.  She had a Monocryl closure of the incision.  Her calf are soft.  Her leg lengths appear equal with her laying supine.  She will slowly increase her activities as comfort allows with no significant restrictions other than not submerging underwater for at least a week to 2 more weeks.  We will then see her back in a month from now but no x-rays are needed.

## 2023-07-27 ENCOUNTER — Other Ambulatory Visit: Payer: Self-pay | Admitting: Family

## 2023-07-27 DIAGNOSIS — M810 Age-related osteoporosis without current pathological fracture: Secondary | ICD-10-CM

## 2023-07-27 NOTE — Telephone Encounter (Signed)
 Copied from CRM [id number]. Topic: Clinical - Medication Refill >> Jul 27, 2023 12:27 PM Chiquita SQUIBB wrote: Medication: ibandronate  (BONIVA ) 150 MG tablet [[id number]]  Has the patient contacted their pharmacy? Yes- [facility] stated that it was denied  (Agent: If no, request that the patient contact the pharmacy for the refill. If patient does not wish to contact the pharmacy document the reason why and proceed with request.) (Agent: If yes, when and what did the pharmacy advise?)  This is the patient's preferred pharmacy:  [id number] [address], [place] - [address] [place] [address] Phone: [phone] Fax: [phone]    Is this the correct pharmacy for this prescription? Yes If no, delete pharmacy and type the correct one.   Has the prescription been filled recently? Yes  Is the patient out of the medication? Yes  Has the patient been seen for an appointment in the last year OR does the patient have an upcoming appointment? Yes  Can we respond through MyChart? Yes  Agent: Please be advised that Rx refills may take up to 3 business days. We ask that you follow-up with your pharmacy.

## 2023-08-04 ENCOUNTER — Telehealth: Payer: Self-pay

## 2023-08-04 ENCOUNTER — Other Ambulatory Visit: Payer: Self-pay

## 2023-08-04 DIAGNOSIS — M810 Age-related osteoporosis without current pathological fracture: Secondary | ICD-10-CM

## 2023-08-04 MED ORDER — IBANDRONATE SODIUM 150 MG PO TABS: 150.0000 mg | ORAL_TABLET | ORAL | 0 refills | Status: DC

## 2023-08-04 NOTE — Telephone Encounter (Signed)
 Copied from CRM [id number]. Topic: Clinical - Prescription Issue >> Aug 04, 2023 10:18 AM Jayma L wrote: Reason for CRM: patient called in asking reason her medicine for : ibandronate  (BONIVA ) 150 MG tablet was denied, asking for a callback said she takes this one a month and needs it to be refilled please. Please call back patient and advise.  RX Sent.

## 2023-08-18 ENCOUNTER — Ambulatory Visit: Admitting: Orthopaedic Surgery

## 2023-08-18 ENCOUNTER — Encounter: Payer: Self-pay | Admitting: Orthopaedic Surgery

## 2023-08-18 DIAGNOSIS — Z96641 Presence of right artificial hip joint: Secondary | ICD-10-CM

## 2023-08-18 NOTE — Progress Notes (Signed)
 The patient is here now 6 weeks status post a right total hip arthroplasty.  She says she is doing great.  She said that she is doing very well and has no issues at all.  She is walking without assistive device.  Her leg lengths are equal.  Her right hip moves smoothly and fluidly and it feels just like her native left hip that has no arthritis.  From our standpoint she can increase her activities as comfort allows with no restrictions.  The next time we need to see her is not for 6 months unless she is having issues.  Will have a standing AP pelvis and lateral of her right hip at that visit.

## 2023-09-08 LAB — COLOGUARD: COLOGUARD: NEGATIVE

## 2023-09-09 ENCOUNTER — Ambulatory Visit: Payer: Self-pay | Admitting: Family

## 2023-10-13 ENCOUNTER — Encounter: Payer: Self-pay | Admitting: Family

## 2023-10-13 ENCOUNTER — Ambulatory Visit: Payer: Self-pay | Admitting: Family

## 2023-10-13 VITALS — BP 121/64 | HR 64 | Temp 97.3°F | Ht 64.0 in | Wt 120.4 lb

## 2023-10-13 DIAGNOSIS — L659 Nonscarring hair loss, unspecified: Secondary | ICD-10-CM

## 2023-10-13 DIAGNOSIS — Z1159 Encounter for screening for other viral diseases: Secondary | ICD-10-CM | POA: Diagnosis not present

## 2023-10-13 DIAGNOSIS — Z1231 Encounter for screening mammogram for malignant neoplasm of breast: Secondary | ICD-10-CM | POA: Diagnosis not present

## 2023-10-13 DIAGNOSIS — Z114 Encounter for screening for human immunodeficiency virus [HIV]: Secondary | ICD-10-CM | POA: Diagnosis not present

## 2023-10-13 DIAGNOSIS — F411 Generalized anxiety disorder: Secondary | ICD-10-CM | POA: Diagnosis not present

## 2023-10-13 DIAGNOSIS — M810 Age-related osteoporosis without current pathological fracture: Secondary | ICD-10-CM | POA: Diagnosis not present

## 2023-10-13 DIAGNOSIS — R5383 Other fatigue: Secondary | ICD-10-CM

## 2023-10-13 DIAGNOSIS — Z0000 Encounter for general adult medical examination without abnormal findings: Secondary | ICD-10-CM

## 2023-10-13 DIAGNOSIS — E782 Mixed hyperlipidemia: Secondary | ICD-10-CM | POA: Diagnosis not present

## 2023-10-13 LAB — TSH: TSH: 2.99 u[IU]/mL (ref 0.35–5.50)

## 2023-10-13 LAB — VITAMIN D 25 HYDROXY (VIT D DEFICIENCY, FRACTURES): VITD: 28.3 ng/mL — ABNORMAL LOW (ref 30.00–100.00)

## 2023-10-13 LAB — COMPREHENSIVE METABOLIC PANEL WITH GFR
ALT: 13 U/L (ref 0–35)
AST: 17 U/L (ref 0–37)
Albumin: 4.5 g/dL (ref 3.5–5.2)
Alkaline Phosphatase: 67 U/L (ref 39–117)
BUN: 17 mg/dL (ref 6–23)
CO2: 28 meq/L (ref 19–32)
Calcium: 9.3 mg/dL (ref 8.4–10.5)
Chloride: 101 meq/L (ref 96–112)
Creatinine, Ser: 0.67 mg/dL (ref 0.40–1.20)
GFR: 95.2 mL/min (ref >60.00)
Glucose, Bld: 90 mg/dL (ref 70–99)
Potassium: 4.7 meq/L (ref 3.5–5.1)
Sodium: 139 meq/L (ref 135–145)
Total Bilirubin: 0.3 mg/dL (ref 0.2–1.2)
Total Protein: 6.7 g/dL (ref 6.0–8.3)

## 2023-10-13 LAB — LIPID PANEL
Cholesterol: 232 mg/dL — ABNORMAL HIGH (ref 0–200)
HDL: 69.6 mg/dL (ref >39.00)
LDL Cholesterol: 143 mg/dL — ABNORMAL HIGH (ref 0–99)
NonHDL: 162.22
Total CHOL/HDL Ratio: 3
Triglycerides: 94 mg/dL (ref 0.0–149.0)
VLDL: 18.8 mg/dL (ref 0.0–40.0)

## 2023-10-13 LAB — FERRITIN: Ferritin: 10.9 ng/mL (ref 10.0–291.0)

## 2023-10-13 MED ORDER — CLONAZEPAM 0.25 MG PO TBDP: 0.2500 mg | ORAL_TABLET | Freq: Every day | ORAL | 2 refills | Status: AC | PRN

## 2023-10-13 MED ORDER — IBANDRONATE SODIUM 150 MG PO TABS: 150.0000 mg | ORAL_TABLET | ORAL | 0 refills | Status: AC

## 2023-10-13 MED ORDER — SERTRALINE HCL 50 MG PO TABS: 50.0000 mg | ORAL_TABLET | Freq: Two times a day (BID) | ORAL | 1 refills | Status: AC

## 2023-10-13 NOTE — Assessment & Plan Note (Signed)
 Last lipids in 2023, TC 215 and LDL 128, controlled with diet, not on statin. - Recheck lipids today - Continue to advise on low saturated fat diet, increased cardio exercise daily. - F/U in 1 year

## 2023-10-13 NOTE — Patient Instructions (Signed)
 It was very nice to see you today!   I will review your lab results via MyChart in a few days.  I have sent in your medication refills. Schedule a follow up visit for between 4-6 months when you need refills.  Glad to see you are doing well after your hip surgery!      PLEASE NOTE:  If you had any lab tests please let us  know if you have not heard back within a few days. You may see your results on MyChart before we have a chance to review them but we will give you a call once they are reviewed by us . If we ordered any referrals today, please let us  know if you have not heard from their office within the next week.

## 2023-10-13 NOTE — Progress Notes (Addendum)
 Phone [phone]  Subjective:   Patient is a 58 y.o. female presenting for annual physical.    Chief Complaint  Patient presents with   Annual Exam    Non Fasting w/ labs   Generalized anxiety disorder  Discussed the use of AI scribe software for clinical note transcription with the patient, who gave verbal consent to proceed.  History of Present Illness   Erika Burns is a 58 year old female who presents for a physical and follow-up for osteoporosis and management of anxiety.  She underwent hip surgery on July 05, 2023, and has recovered well. Six weeks post-surgery, she was ambulating without a cane and did not require physical therapy, opting for home exercises. She avoids high-impact activities and manages any discomfort with Tylenol .  She is prescribed Klonopin  at a low dose for anxiety, which she uses sparingly, and buspirone  at 7.5 mg. She finds Klonopin  effective but is cautious about regular use. She has significantly reduced her alcohol intake since her surgery and does not smoke or use THC.  She experiences hair thinning since August 2025, possibly due to menopausal changes. She also notes increased fatigue and headaches, considering if these may be related to aging or her recent surgery. She takes Boniva  for osteopenia and supplements with magnesium and vitamin D3, which she finds beneficial for headaches and overall health. Her platelet count is currently within the normal range.   She is not on medication for her high cholesterol. She tries to reduce saturated fat in her diet. Last lipid panel as follows: Lab Results  Component Value Date   CHOL 215 (H) 11/30/2021   HDL 65.00 11/30/2021   LDLCALC 128 (H) 11/30/2021   TRIG 109.0 11/30/2021   CHOLHDL 3 11/30/2021   See problem oriented charting- ROS- full  review of systems was completed and negative except what is noted in HPI above.  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   Gastroparesis 06/23/2022   Cervicalgia 05/17/2022   Age-related osteoporosis without current pathological fracture 11/30/2021   Generalized anxiety disorder 11/17/2021   Past Surgical History:  Procedure Laterality Date   ABDOMINAL SURGERY  2005   repair of bowel obstruction   BREAST SURGERY  2004   breast implants   ESOPHAGOGASTRODUODENOSCOPY N/A 04/15/2012   Procedure: ESOPHAGOGASTRODUODENOSCOPY (EGD);  Surgeon: Oliva FORBES Boots, MD;  Location: [facility] ENDOSCOPY;  Service: Endoscopy;  Laterality: N/A;   TOTAL HIP ARTHROPLASTY Right 07/05/2023   Procedure: ARTHROPLASTY, HIP, TOTAL, ANTERIOR APPROACH;  Surgeon: Vernetta Lonni GRADE, MD;  Location: [facility] OR;  Service: Orthopedics;  Laterality: Right;    Family History  Problem Relation Age of Onset   Heart disease Mother    Depression Mother    Hypertension Father    Diabetes Father    Depression Father    Heart disease Brother    Learning disabilities Son     Medications- reviewed and updated Current Outpatient Medications  Medication Sig Dispense Refill   busPIRone  (BUSPAR ) 7.5 MG tablet Take 1 tablet (7.5 mg total) by mouth 2 (two) times daily. (Patient taking differently: Take 7.5 mg by mouth daily.) 180 tablet 3   Cholecalciferol  (VITAMIN D3) 10 MCG (400 UNIT) tablet Take  800 Units by mouth daily.     estradiol (VIVELLE-DOT) 0.05 MG/24HR patch Place 1 patch onto the skin 2 (two) times a week.     MAGNESIUM OXIDE PO Take 1 tablet by mouth daily.     omeprazole (PRILOSEC OTC) 20 MG tablet Take 20 mg by mouth daily.     progesterone  (PROMETRIUM ) 100 MG capsule Take 100 mg by mouth daily.     clonazePAM  (KLONOPIN ) 0.25 MG disintegrating tablet Take 1-2 tablets (0.25-0.5 mg  total) by mouth daily as needed (Severe anxiety). 30 tablet 2   ibandronate  (BONIVA ) 150 MG tablet Take 1 tablet (150 mg total) by mouth every 30 (thirty) days. 12 tablet 0   sertraline  (ZOLOFT ) 50 MG tablet Take 1 tablet (50 mg total) by mouth in the morning and at bedtime. 180 tablet 1   No current facility-administered medications for this visit.    Allergies-reviewed and updated No Known Allergies  Social History   Social History Narrative   Not on file    Objective:  BP 121/64 (BP Location: Left Arm, Patient Position: Sitting, Cuff Size: Normal)   Pulse 64   Temp (!) 97.3 F (36.3 C) (Temporal)   Ht 5' 4 (1.626 m)   Wt 123 lb (55.8 kg)   SpO2 94%   BMI 21.11 kg/m  Physical Exam Vitals and nursing note reviewed.  Constitutional:      Appearance: Normal appearance.  HENT:     Head: Normocephalic.     Right Ear: Tympanic membrane normal.     Left Ear: Tympanic membrane normal.     Nose: Nose normal.     Mouth/Throat:     Mouth: Mucous membranes are moist.  Eyes:     Pupils: Pupils are equal, round, and reactive to light.  Cardiovascular:     Rate and Rhythm: Normal rate and regular rhythm.  Pulmonary:     Effort: Pulmonary effort is normal.     Breath sounds: Normal breath sounds.  Musculoskeletal:        General: Normal range of motion.     Cervical back: Normal range of motion.  Lymphadenopathy:     Cervical: No cervical adenopathy.  Skin:    General: Skin is warm and dry.  Neurological:     Mental Status: She is alert.  Psychiatric:        Mood and Affect: Mood normal.        Behavior: Behavior normal.     Assessment and Plan   Health Maintenance counseling: 1. Anticipatory guidance: Patient counseled regarding regular dental exams q6 months, eye exams,  avoiding smoking and second hand smoke, limiting alcohol to 1 beverage per day, no illicit drugs.   2. Risk factor reduction:  Advised patient of need for regular exercise and diet rich with fruits  and vegetables to reduce risk of heart attack and stroke. Exercise- none currently.  Wt Readings from Last 3 Encounters:  10/13/23 123 lb (55.8 kg)  07/05/23 124 lb (56.2 kg)  04/26/23 123 lb (55.8 kg)   3. Immunizations/screenings/ancillary studies Immunization History  Administered Date(s) Administered   Influenza,inj,Quad PF,6+ Mos 10/27/2017   Influenza-Unspecified 11/19/2019   PFIZER(Purple Top)SARS-COV-2 Vaccination 04/27/2019   Pfizer Covid-19 Vaccine Bivalent Booster 23yrs & up 02/11/2020   Unspecified SARS-COV-2 Vaccination 06/07/2019   Zoster, Live 12/19/2019   Health Maintenance Due  Topic Date Due   HIV Screening  Never done   Mammogram  07/30/2023    4. Cervical cancer screening- due 03/2024 5.  Breast cancer screening-  mammogram: ordering today; DEXA scan due 2026 6. Colon cancer screening - Cologuard 2024 normal 7. Skin cancer screening- advised regular sunscreen use. Denies worrisome, changing, or new skin lesions.  8. Birth control/STD check- on Estradiol patch & progesterone  9. Osteoporosis screening-  done this year, Osteoporosis right hip, Osteopenia in left hip, taking Boniva  qmonthly 10. Alcohol screening: 2 drinks/week 11. Smoking associated screening (lung cancer screening, AAA screen 65-75, UA)- non- smoker  Assessment and Plan    Adult Wellness Visit Routine wellness visit with pending labs. Mammogram due. Pap smear due next year. Cologuard results satisfactory. - Order full labs today. - Order mammogram. - Check HIV and hepatitis C as part of routine screening. - Encourage healthy diet and daily cardio exercise.  Generalized anxiety disorder Managed with low-dose Klonopin  prn, Zoloft  50mg  qd and Buspar  7.5mg  qd. She prefers not to increase buspirone .  Aware of Klonopin  risks including addiction and dementia. - Refill Klonopin  with two refills, 30 pills, as needed for anxiety, advised not to take daily, better to increase the Buspar  if needed. -  Schedule follow-up in 4-6 months for controlled substance monitoring, with option for virtual visit.  Age-related osteoporosis without current pathological fracture Managed with Boniva  150mg  monthly, magnesium, and vitamin D3. Bone density test recommended next year. - Continue Boniva , refill sent for 1 year. - Order DEXA scan next year. - Check vitamin D  level today. - Recommend vitamin D3 up to 5000 units daily.  Hyperlipidemia Last lipids in 2023, TC 215 and LDL 128, controlled with diet, not on statin. - Recheck lipids today - Continue to advise on low saturated fat diet, increased cardio exercise daily. - F/U in 1 year  Hair loss Recent onset with possible thyroid, menopause and iron store contributions. - Check thyroid function. - Check ferritin levels.     Recommended follow up: Return in about 4 months (around 02/12/2024) for anxiety, med refills, virtual ok. Future Appointments  Date Time Provider Department Center  02/16/2024  4:00 PM Vernetta Lonni GRADE, MD [facility] None   Lab/Order associations: fasting  Lucius Krabbe, NP

## 2023-10-13 NOTE — Assessment & Plan Note (Signed)
 Managed with Boniva  150mg  monthly, magnesium, and vitamin D3. Bone density test recommended next year. - Continue Boniva , refill sent for 1 year. - Order DEXA scan next year. - Check vitamin D  level today. - Recommend vitamin D3 up to 5000 units daily.

## 2023-10-13 NOTE — Assessment & Plan Note (Addendum)
 Managed with low-dose Klonopin  prn, Zoloft  50mg  qd and Buspar  7.5mg  qd. She prefers not to increase buspirone .  Aware of Klonopin  risks including addiction and dementia. - Refill Klonopin  with two refills, 30 pills, as needed for anxiety, advised not to take daily, better to increase the Buspar  if needed. - Schedule follow-up in 4-6 months for controlled substance monitoring, with option for virtual visit.

## 2023-10-14 LAB — HIV ANTIBODY (ROUTINE TESTING W REFLEX)
HIV 1&2 Ab, 4th Generation: NONREACTIVE
HIV FINAL INTERPRETATION: NEGATIVE

## 2023-10-14 LAB — HEPATITIS C ANTIBODY: Hepatitis C Ab: NONREACTIVE

## 2023-10-17 ENCOUNTER — Ambulatory Visit: Payer: Self-pay | Admitting: Family

## 2023-10-17 DIAGNOSIS — E782 Mixed hyperlipidemia: Secondary | ICD-10-CM

## 2023-10-17 DIAGNOSIS — K3184 Gastroparesis: Secondary | ICD-10-CM

## 2023-10-18 MED ORDER — ROSUVASTATIN CALCIUM 5 MG PO TABS: 5.0000 mg | ORAL_TABLET | ORAL | 3 refills | Status: AC

## 2023-10-18 NOTE — Telephone Encounter (Signed)
 Crestor sent, thx.

## 2023-10-27 MED ORDER — METOCLOPRAMIDE HCL 5 MG PO TABS: 5.0000 mg | ORAL_TABLET | Freq: Three times a day (TID) | ORAL | 0 refills | Status: DC | PRN

## 2023-10-27 NOTE — Addendum Note (Signed)
 Addended by: Rosine Solecki on: 10/27/2023 03:35 PM   Modules accepted: Orders

## 2023-10-27 NOTE — Telephone Encounter (Signed)
 medication sent

## 2023-11-21 ENCOUNTER — Encounter: Payer: Self-pay | Admitting: Radiology

## 2023-11-30 ENCOUNTER — Other Ambulatory Visit (INDEPENDENT_AMBULATORY_CARE_PROVIDER_SITE_OTHER): Payer: Self-pay

## 2023-11-30 ENCOUNTER — Ambulatory Visit: Admitting: Orthopaedic Surgery

## 2023-11-30 ENCOUNTER — Encounter: Payer: Self-pay | Admitting: Orthopaedic Surgery

## 2023-11-30 DIAGNOSIS — M79604 Pain in right leg: Secondary | ICD-10-CM

## 2023-11-30 MED ORDER — METHOCARBAMOL 500 MG PO TABS
500.0000 mg | ORAL_TABLET | Freq: Four times a day (QID) | ORAL | 1 refills | Status: AC | PRN
Start: 2023-11-30 — End: ?

## 2023-11-30 MED ORDER — METHYLPREDNISOLONE 4 MG PO TABS: ORAL_TABLET | ORAL | 0 refills | Status: AC

## 2023-11-30 NOTE — Progress Notes (Signed)
 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  She had been doing really well until about a week ago or so she developed some pain in her right leg.  She points to the low back in the ischial area as a source of her pain it did radiate into her thigh.  She denied any groin pain.  She is very active and thin individual.  The pain was radiating into her sciatic region on the right side.  She went to make sure her hip replacement was doing well.  She is walking without assistive device and she said the pain is calming down some.  Her right hip moves smoothly and fluidly.  There is no pain over the trochanteric area of her right hip but there is pain over her ischium and the low back on the right side and some in the pelvis on the right side.  An AP pelvis and lateral the right hip shows a well-seated right total hip arthroplasty with no complicating features.  The left hip joint space is well-maintained.  An AP and lateral lumbar spine show some slight degenerative changes.  This may more likely be back related.  I would like to start a 6-day steroid taper as well as some Robaxin to see if this will help her symptoms.  She is feeling better overall we will see if these medications can help this continue to improve.  Will see her back in 4 weeks to see how she doing overall but no x-rays are needed.  If she is doing well before then she can cancel that appointment.  However if she is digressing before then she will give us  a call because we would likely order MRI of her lumbar spine.

## 2023-12-12 ENCOUNTER — Ambulatory Visit: Admitting: Orthopaedic Surgery

## 2023-12-16 ENCOUNTER — Encounter: Payer: Self-pay | Admitting: Family

## 2023-12-20 ENCOUNTER — Other Ambulatory Visit: Payer: Self-pay | Admitting: Family

## 2023-12-20 DIAGNOSIS — K3184 Gastroparesis: Secondary | ICD-10-CM

## 2023-12-20 MED ORDER — METOCLOPRAMIDE HCL 5 MG PO TABS: 5.0000 mg | ORAL_TABLET | Freq: Three times a day (TID) | ORAL | 0 refills | Status: AC | PRN

## 2023-12-20 NOTE — Telephone Encounter (Signed)
 done

## 2023-12-23 ENCOUNTER — Other Ambulatory Visit: Payer: Self-pay | Admitting: Family

## 2023-12-23 DIAGNOSIS — K3184 Gastroparesis: Secondary | ICD-10-CM

## 2023-12-28 ENCOUNTER — Encounter: Admitting: Orthopaedic Surgery

## 2024-01-02 ENCOUNTER — Ambulatory Visit: Admitting: Family

## 2024-02-10 ENCOUNTER — Telehealth: Payer: Self-pay

## 2024-02-10 DIAGNOSIS — N951 Menopausal and female climacteric states: Secondary | ICD-10-CM

## 2024-02-10 NOTE — Telephone Encounter (Signed)
 Please advise request, you are not the prescriber.

## 2024-02-10 NOTE — Telephone Encounter (Signed)
 Copied from CRM #[id number]. Topic: Clinical - Medication Refill >> Feb 10, 2024  9:59 AM Hadassah PARAS wrote: Medication: estradiol (VIVELLE-DOT) 0.05 MG/24HR patch   Has the patient contacted their pharmacy? Yes (Agent: If no, request that the patient contact the pharmacy for the refill. If patient does not wish to contact the pharmacy document the reason why and proceed with request.) (Agent: If yes, when and what did the pharmacy advise?)  This is the patient's preferred pharmacy:  [facility] Address: [address] [place], [place] [address] Phone: [phone]  Is this the correct pharmacy for this prescription? Yes If no, delete pharmacy and type the correct one.   Has the prescription been filled recently? Yes  Is the patient out of the medication? Yes  Has the patient been seen for an appointment in the last year OR does the patient have an upcoming appointment? Yes  Can we respond through MyChart? Yes  Agent: Please be advised that Rx refills may take up to 3 business days. We ask that you follow-up with your pharmacy.

## 2024-02-14 MED ORDER — ESTRADIOL 0.05 MG/24HR TD PTTW: 1.0000 | MEDICATED_PATCH | TRANSDERMAL | 3 refills | Status: AC

## 2024-02-14 NOTE — Telephone Encounter (Signed)
 RX sent to [facility]

## 2024-02-14 NOTE — Addendum Note (Signed)
 Addended by: Ebonye Reade on: 02/14/2024 05:02 PM   Modules accepted: Orders

## 2024-02-16 ENCOUNTER — Encounter: Admitting: Orthopaedic Surgery

## 2024-02-22 ENCOUNTER — Other Ambulatory Visit (HOSPITAL_COMMUNITY): Payer: Self-pay

## 2024-02-22 ENCOUNTER — Other Ambulatory Visit: Payer: Self-pay

## 2024-02-22 ENCOUNTER — Other Ambulatory Visit (HOSPITAL_BASED_OUTPATIENT_CLINIC_OR_DEPARTMENT_OTHER): Payer: Self-pay

## 2024-02-22 MED ORDER — ESTRADIOL 0.05 MG/24HR TD PTTW
1.0000 | MEDICATED_PATCH | TRANSDERMAL | 0 refills | Status: AC
  Filled 2024-02-22: qty 8, 28d supply, fill #0
